# Patient Record
Sex: Male | Born: 1950 | Race: White | Hispanic: No | Marital: Married | Smoking: Never smoker
Health system: Southern US, Academic
[De-identification: ages and names within clinical notes are randomized; demographics above are authoritative.]

## PROBLEM LIST (undated history)

## (undated) DIAGNOSIS — N400 Enlarged prostate without lower urinary tract symptoms: Secondary | ICD-10-CM

## (undated) DIAGNOSIS — C801 Malignant (primary) neoplasm, unspecified: Secondary | ICD-10-CM

## (undated) DIAGNOSIS — K219 Gastro-esophageal reflux disease without esophagitis: Secondary | ICD-10-CM

## (undated) HISTORY — DX: Benign prostatic hyperplasia without lower urinary tract symptoms: N40.0

## (undated) HISTORY — DX: Gastro-esophageal reflux disease without esophagitis: K21.9

## (undated) HISTORY — PX: SKIN SURGERY: SHX2413

## (undated) HISTORY — DX: Malignant (primary) neoplasm, unspecified: C80.1

## (undated) HISTORY — DX: Malignant (primary) neoplasm, unspecified (CMS HCC): C80.1

## (undated) HISTORY — PX: HX TONSILLECTOMY: SHX27

## (undated) HISTORY — PX: SHOULDER SURGERY: SHX246

## (undated) NOTE — Progress Notes (Signed)
 Formatting of this note is different from the original.  Images from the original note were not included.  Subjective   Patient ID: Bobby Flynn is a 35 y.o. male presenting to the Urgent Care with a chief complaint of Dental Pain (Upper left back tooth pain x 3 days. ).    Dental pain left upper molar    History provided by:  Patient  Dental Pain  Location:  Upper  Upper teeth location:  14/LU 1st molar  Quality:  Aching and constant  Onset quality:  Sudden  Duration:  3 days    Objective   Vitals:    10/14/24 0827   BP: (!) 145/71   Pulse: 77   Resp: 18   Temp: 36.6 C (97.8 F)   TempSrc: Oral   SpO2: 98%   Weight: 79.4 kg (175 lb)   Height: 1.803 m (5' 11)     No results found.   Vital signs reviewed.    Physical Exam  Vitals and nursing note reviewed.   Constitutional:       Appearance: Normal appearance.   HENT:      Head: Normocephalic and atraumatic.      Mouth/Throat:     Cardiovascular:      Rate and Rhythm: Normal rate and regular rhythm.      Pulses: Normal pulses.      Heart sounds: Normal heart sounds.   Pulmonary:      Effort: Pulmonary effort is normal.      Breath sounds: Normal breath sounds.   Neurological:      Mental Status: He is alert.         Assessment & Plan    Assessment & Plan  Dental abscess        Other orders    amoxicillin-clavulanate (Augmentin) 875-125 MG tablet; Take 1 tablet by mouth 2 times a day for 7 days.    In-House Lab Results:   No results found for this or any previous visit.     In-House Imaging Reads:        Procedure Documentation:  Procedures     ED Course & MDM   MDM - Medical Decision Making: Independent historian used.   Electronically signed by Estell Palmer, DO at 10/14/2024  8:37 AM EST

---

## 2002-03-05 ENCOUNTER — Other Ambulatory Visit (HOSPITAL_COMMUNITY): Payer: Self-pay

## 2018-12-01 IMAGING — US US RETROPERITONEAL COMPLETE
1 series · 13 of 25 positions shown · non-contrast
Comparison: None.

EXAM:  US RETROPERITONEAL COMPLETE
INDICATION: History of left kidney cyst.  Chronic kidney disease.
TECHNIQUE: Multiplanar sonographic images of the retroperitoneum were performed with attention to the urinary tract.

[Series 1: us retroperitoneal complete · 0.24mm/px · 13 of 67 slices shown]
[im 1/67]
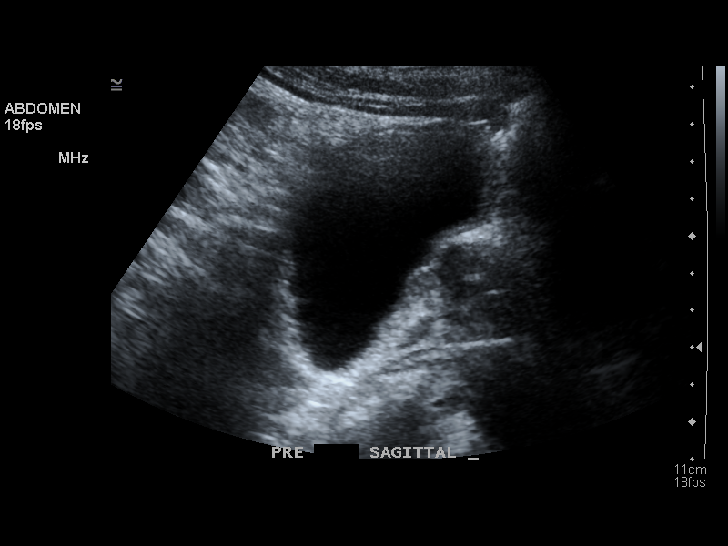
[im 6/67]
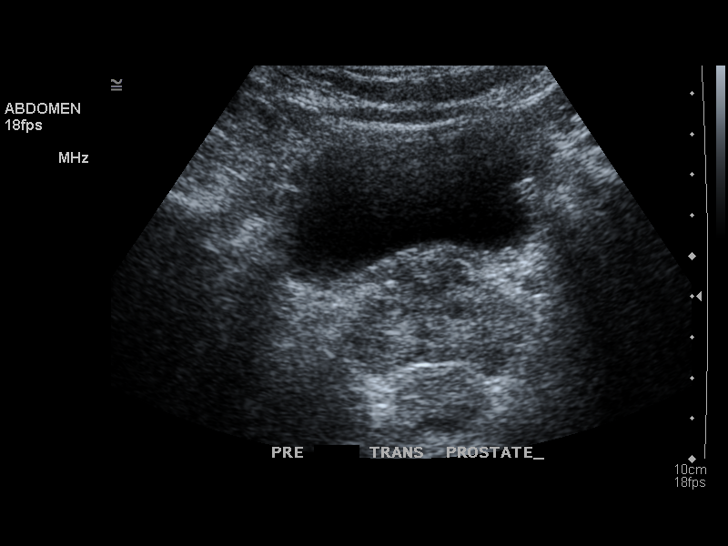
[im 12/67]
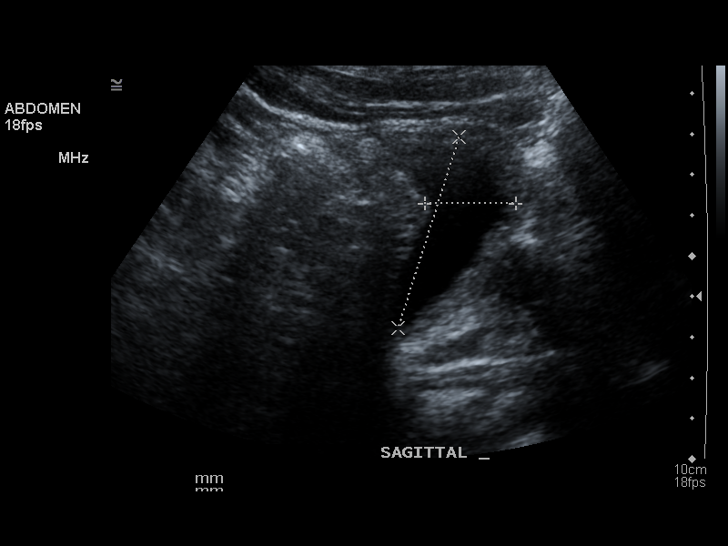
[im 17/67]
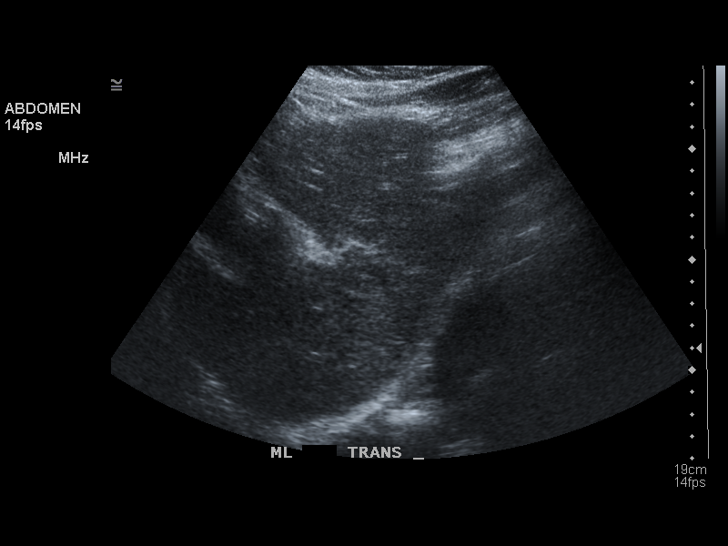
[im 23/67]
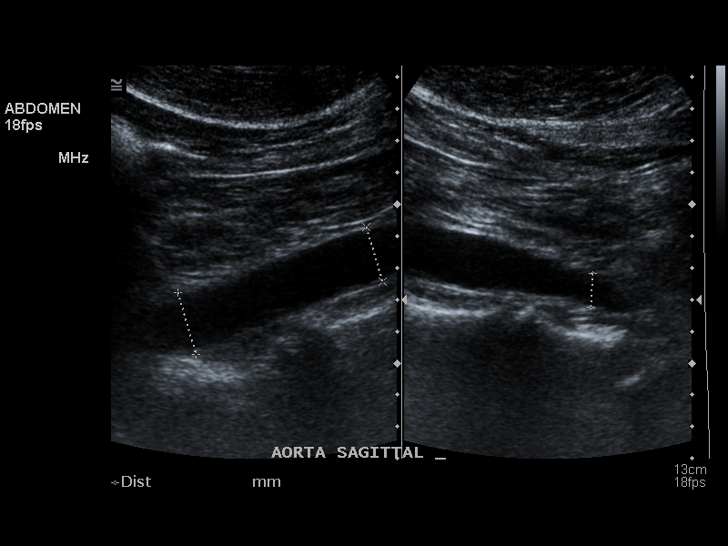
[im 28/67]
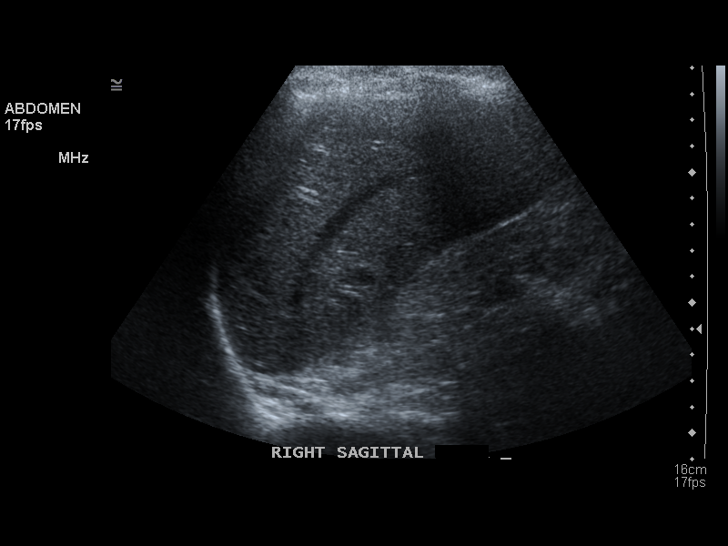
[im 34/67]
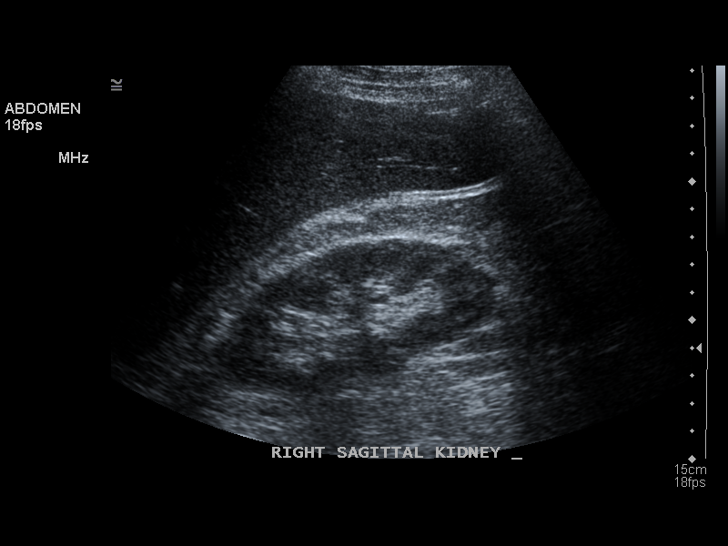
[im 39/67]
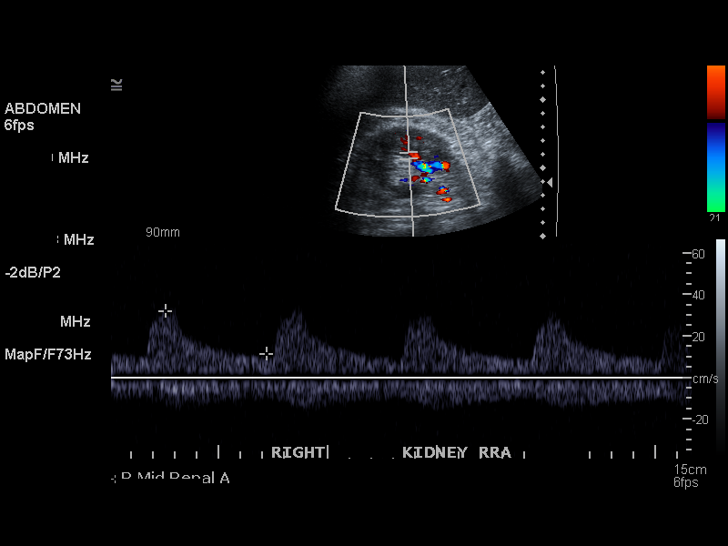
[im 45/67]
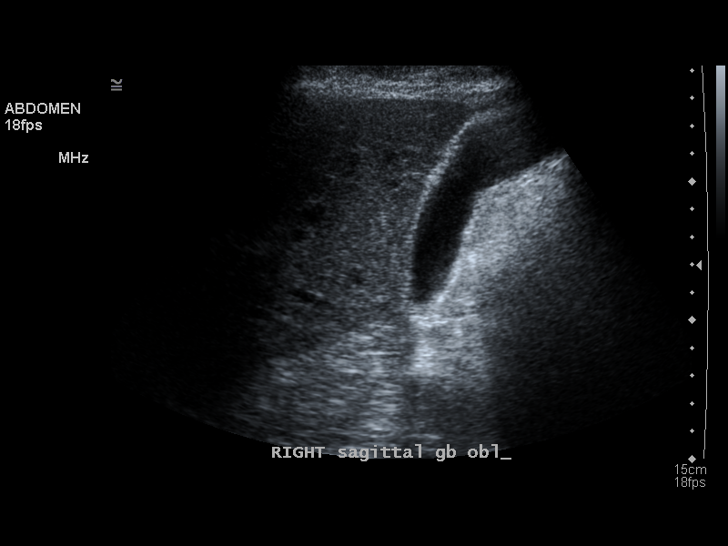
[im 50/67]
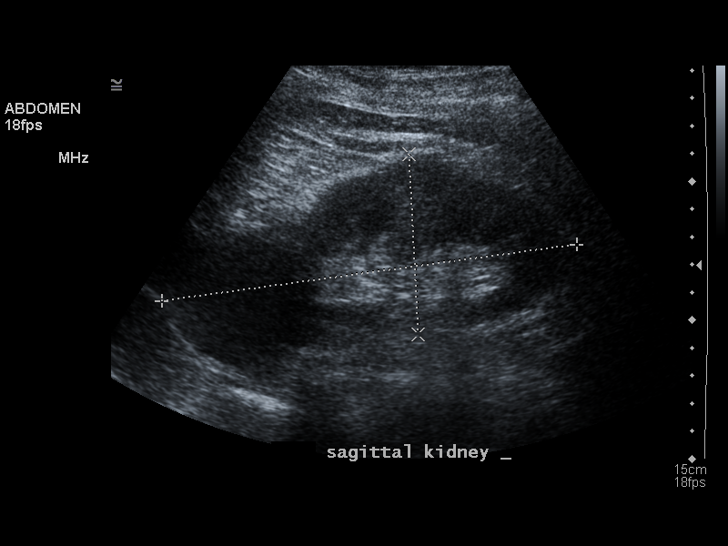
[im 56/67]
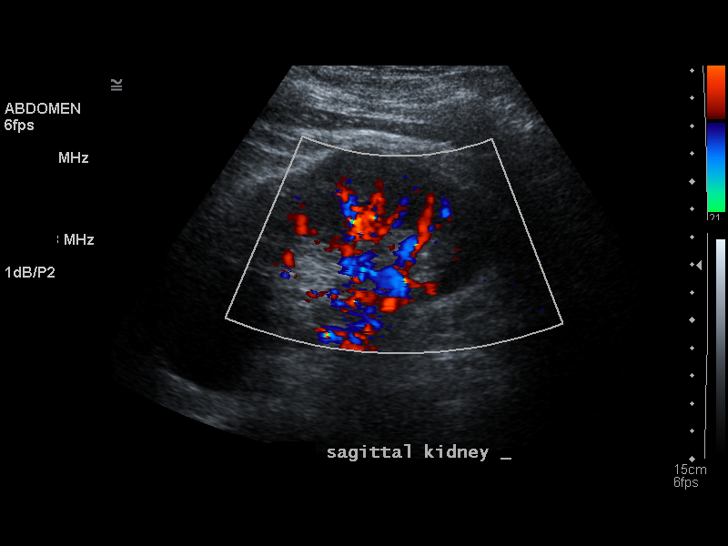
[im 61/67]
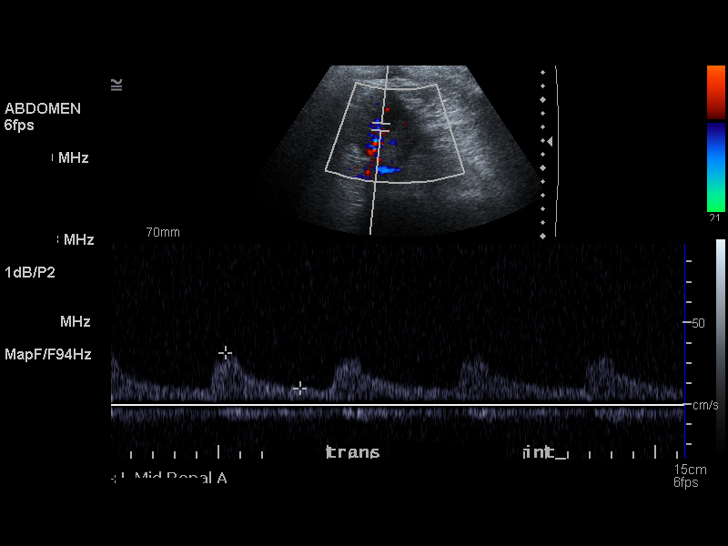
[im 67/67]
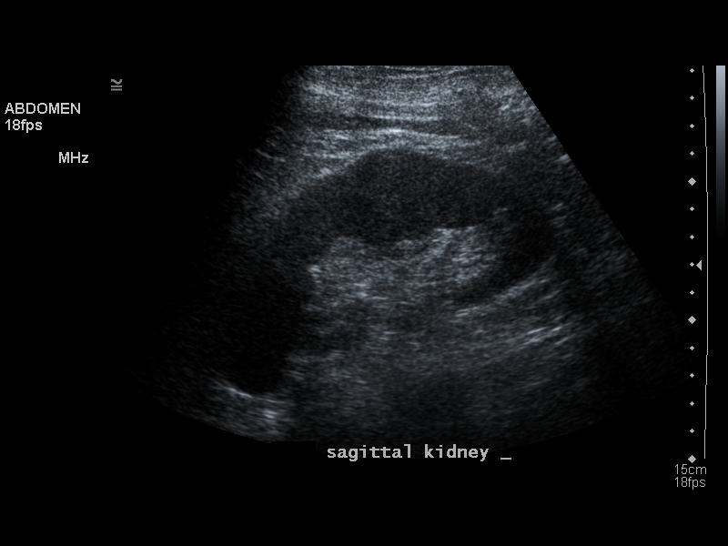

[13 of 25 positions shown; findings below may reference images not displayed]

FINDINGS: The right kidney measures 11.9 x 5.0 x 4.9 cm.  The left kidney measures 15.1 x 6.5 x 5.6 cm.  There is a septated, exophytic cyst in the upper pole of the left kidney.  It measures 8.3 x 5.7 x 7.5 cm.  Renal echogenicity is otherwise normal.  No shadowing calculi are seen.  There is no hydronephrosis.  Right intrarenal resistive index is 0.68.  Left intrarenal resistive index is 0.74.  Sonographic survey of the urinary bladder is unremarkable.  Prevoid bladder volume is 145.5 cc.  Postvoid residual is 20 cc.  Note is made of prostatic enlargement, measuring 4.6 x 2.7 x 5.1 cm.  It contains echogenic foci consistent with calcifications.  Sonographic survey of the remainder of the abdomen demonstrates normal appearance to the IVC and visualized portions of the pancreas.  Abdominal aorta has normal dimensions.  Liver size is normal.
IMPRESSION: Left renal cyst.  

No calculus identified.  

No hydronephrosis.  

Prostatic enlargement.

## 2019-12-17 IMAGING — US US RETROPERITONEAL COMPLETE
1 series · 14 of 25 positions shown · non-contrast
Comparison: Sonogram dated 12/01/2018.

EXAM:  US RETROPERITONEAL COMPLETE
INDICATION: Left renal cyst.

[Series 3: us retroperitoneal complete · 0.37mm/px · 14 of 77 slices shown]
[im 1/77]
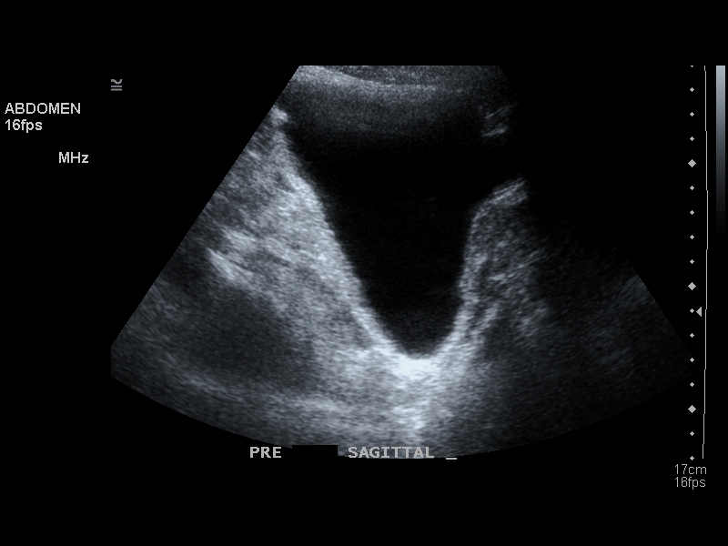
[im 7/77]
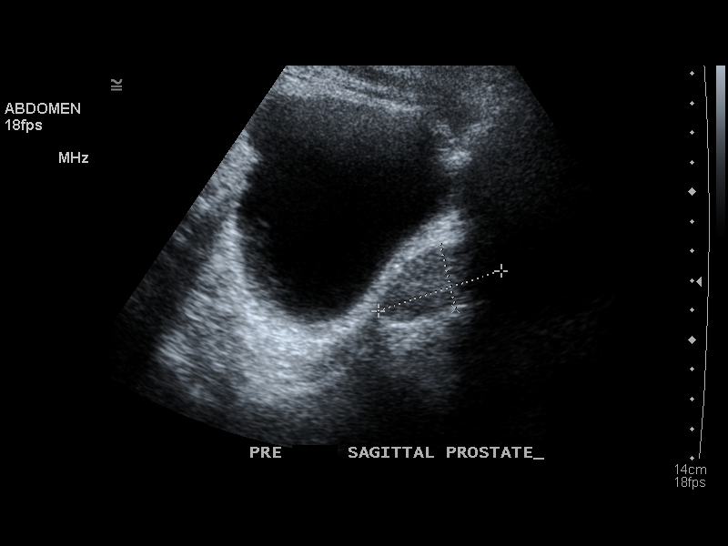
[im 13/77]
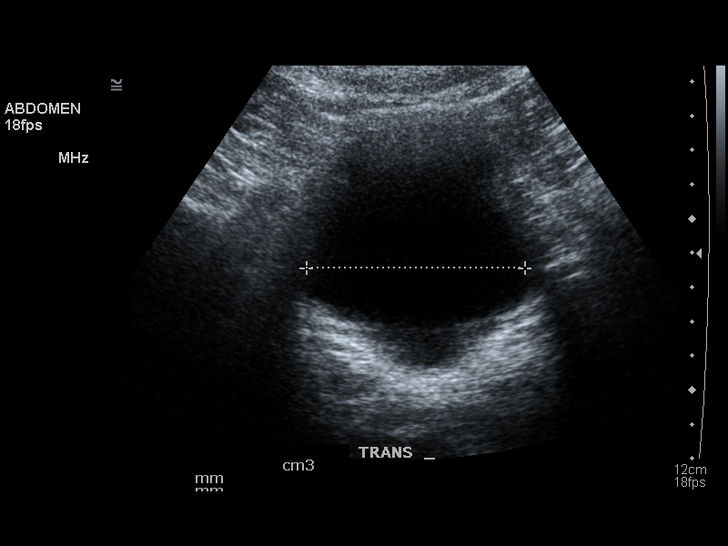
[im 20/77]
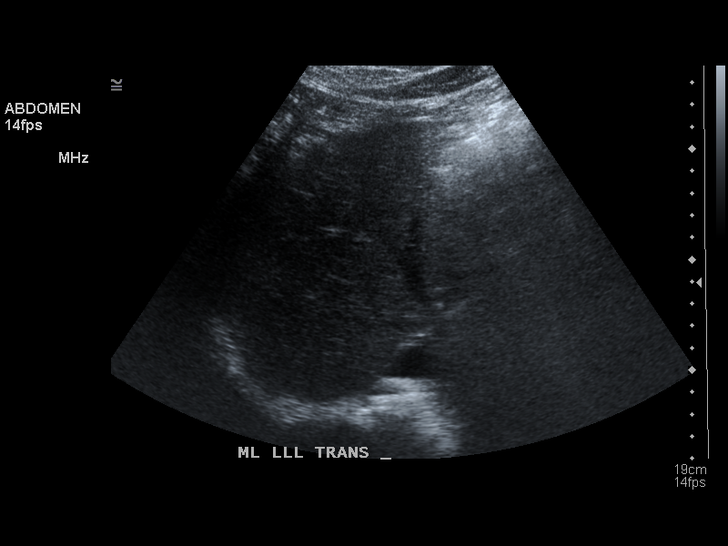
[im 26/77]
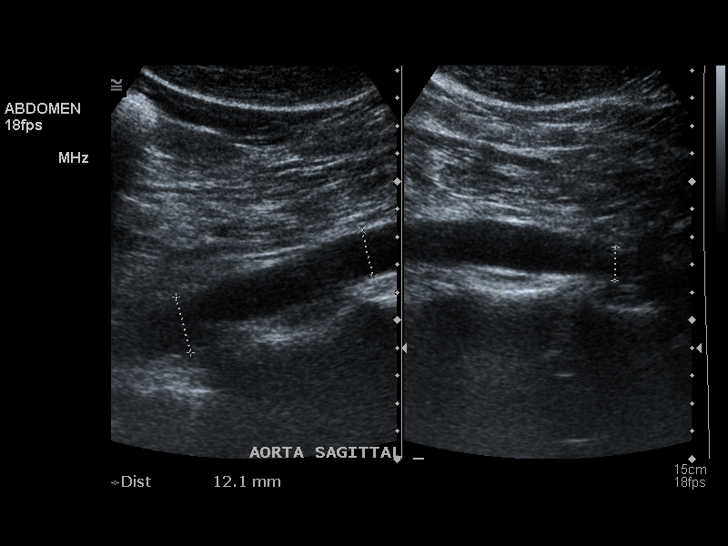
[im 29/77]
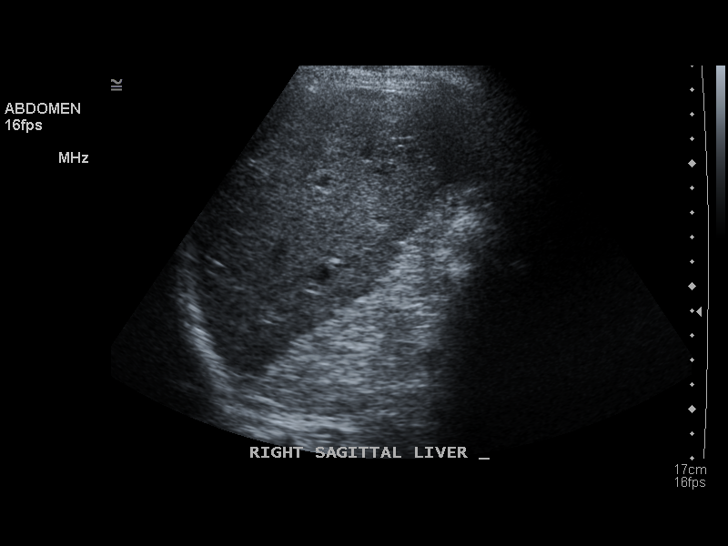
[im 35/77]
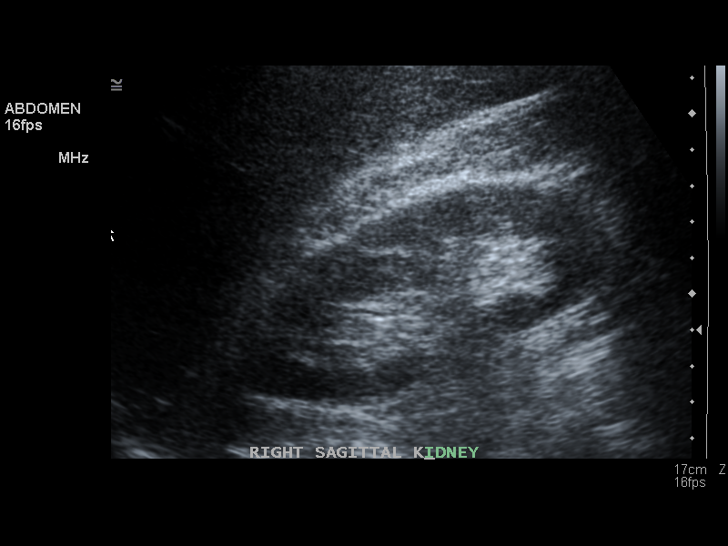
[im 42/77]
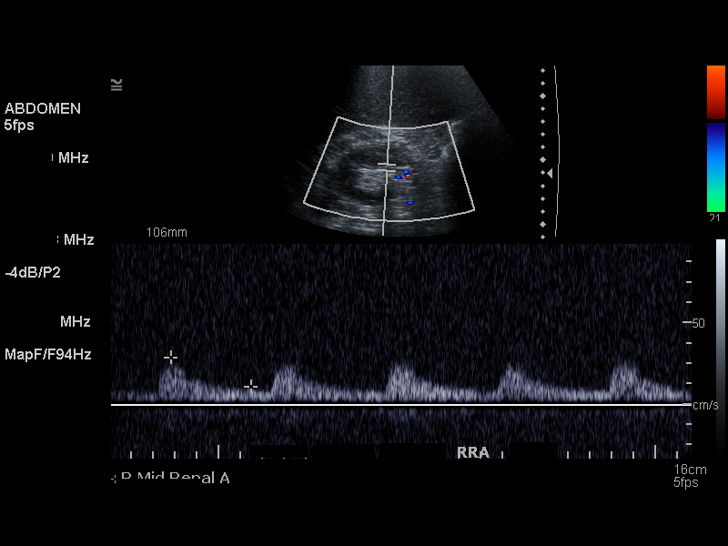
[im 48/77]
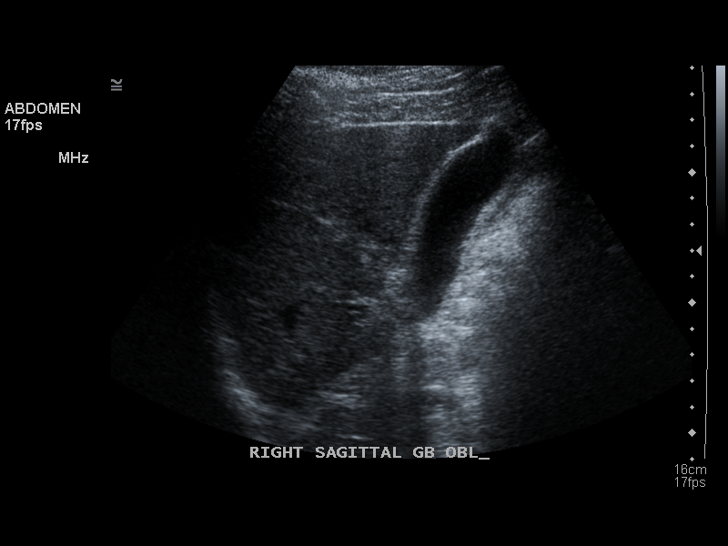
[im 51/77]
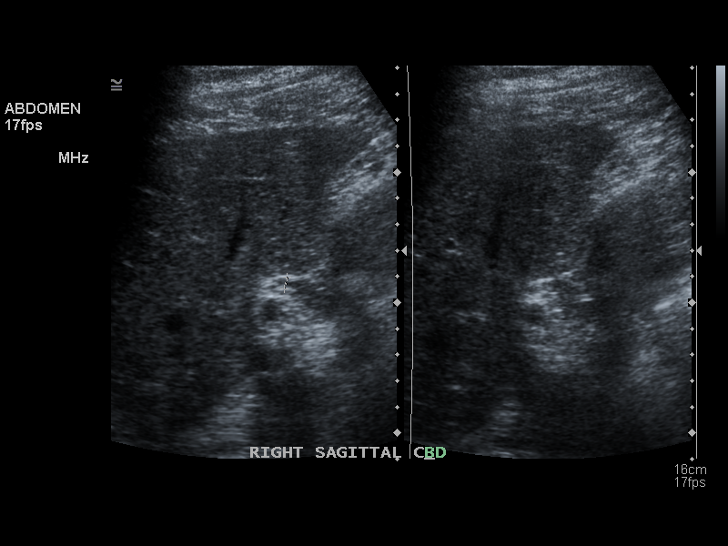
[im 58/77]
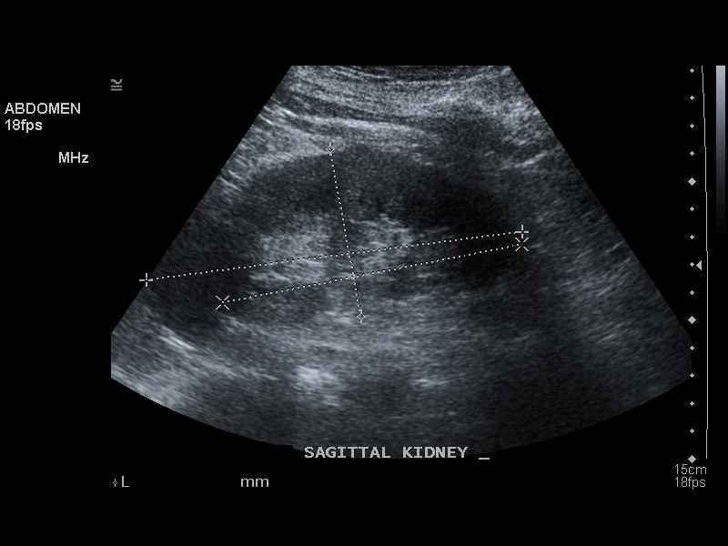
[im 64/77]
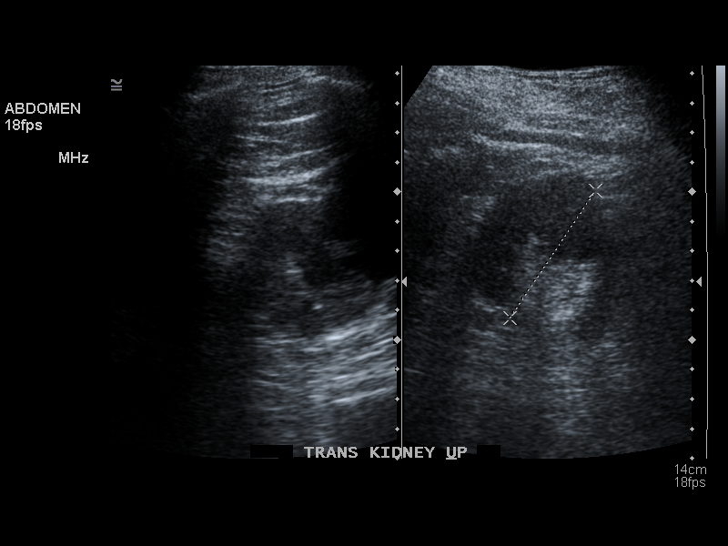
[im 70/77]
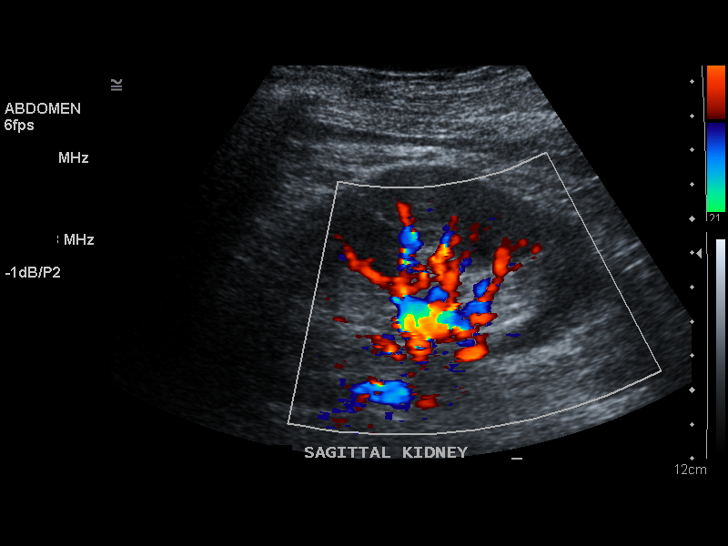
[im 77/77]
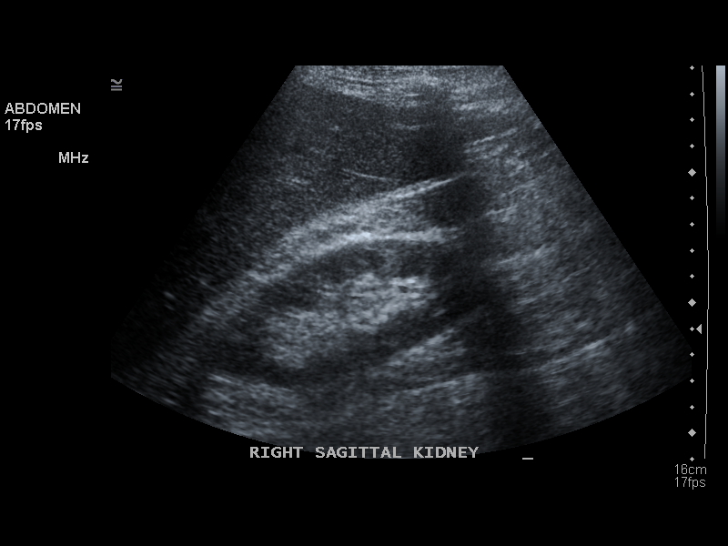

[14 of 25 positions shown; findings below may reference images not displayed]

FINDINGS: Kidneys are normal in echogenicity and measure 11 cm bilaterally. 8 cm lobulated left kidney upper pole exophytic cyst is again identified. There is no hydronephrosis, mass or shadowing calculus on either side.

Urinary bladder is partially distended and grossly unremarkable. There is a significant post void residual of 167 cubic cm.
IMPRESSION: Stable 8 cm left kidney upper pole cyst. 

Significant post void residual.

## 2020-12-29 IMAGING — US US RETROPERITONEAL COMPLETE
1 series · 14 of 25 positions shown · non-contrast
Comparison: Sonogram dated 12/17/2019.

﻿EXAM:  US RETROPERITONEAL COMPLETE
INDICATION: History of left renal cyst.

[Series 1: us retroperitoneal complete · 14 of 63 slices shown]
[im 1/63]
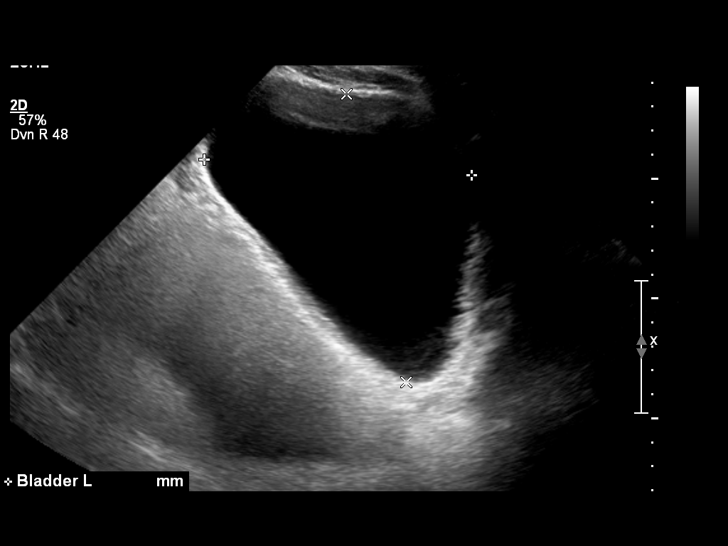
[im 6/63]
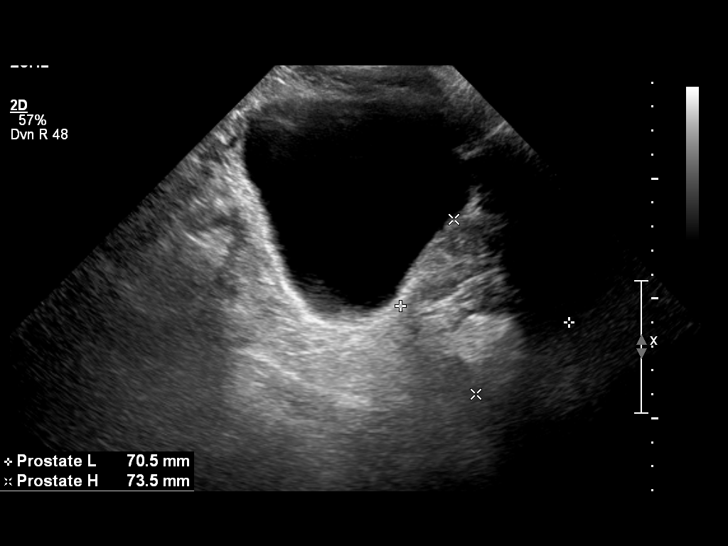
[im 11/63]
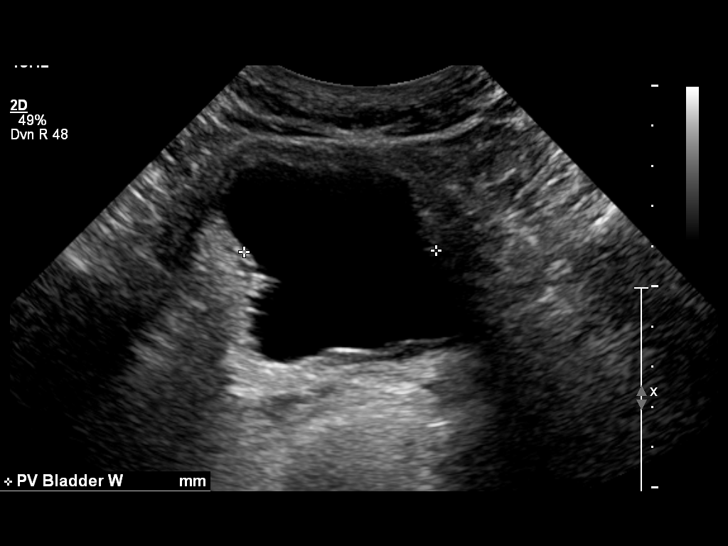
[im 16/63]
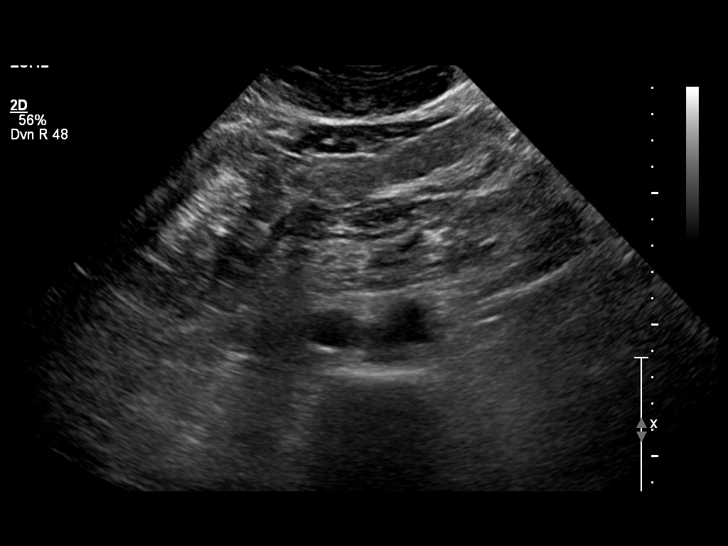
[im 21/63]
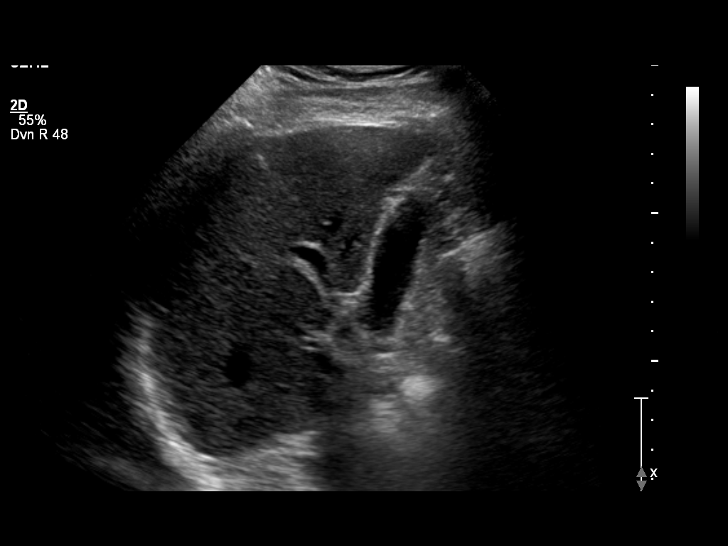
[im 24/63]
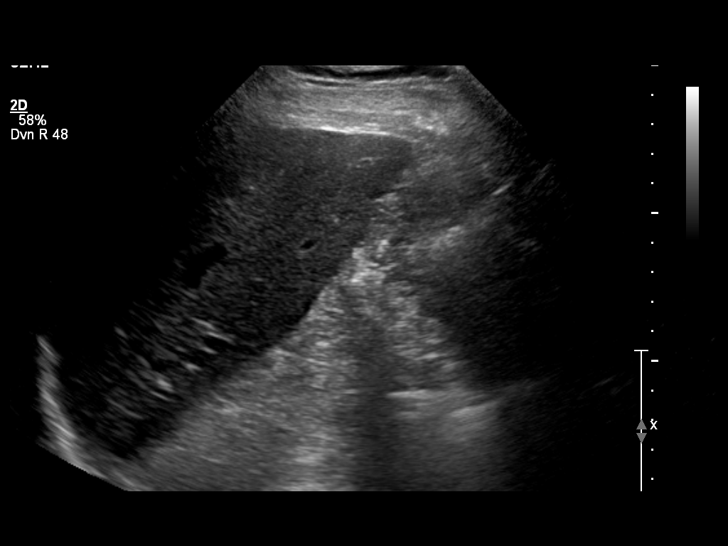
[im 29/63]
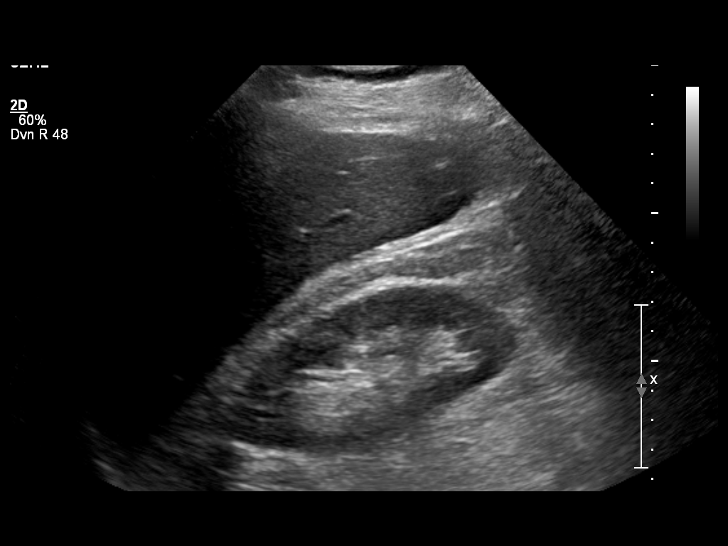
[im 34/63]
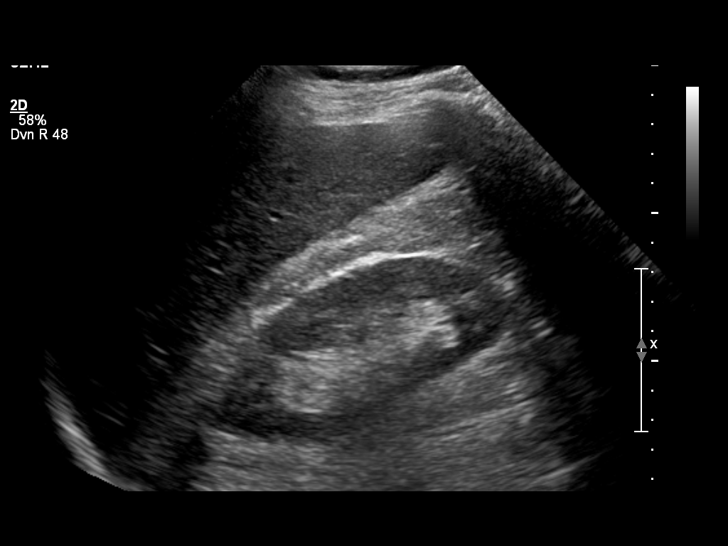
[im 39/63]
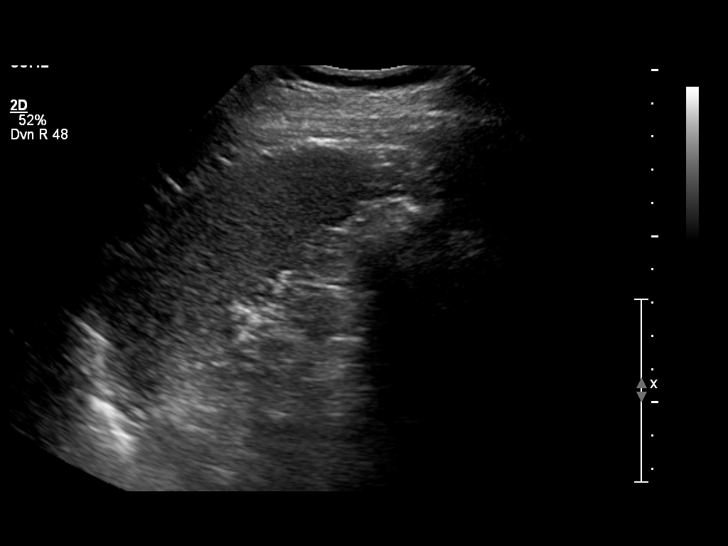
[im 42/63]
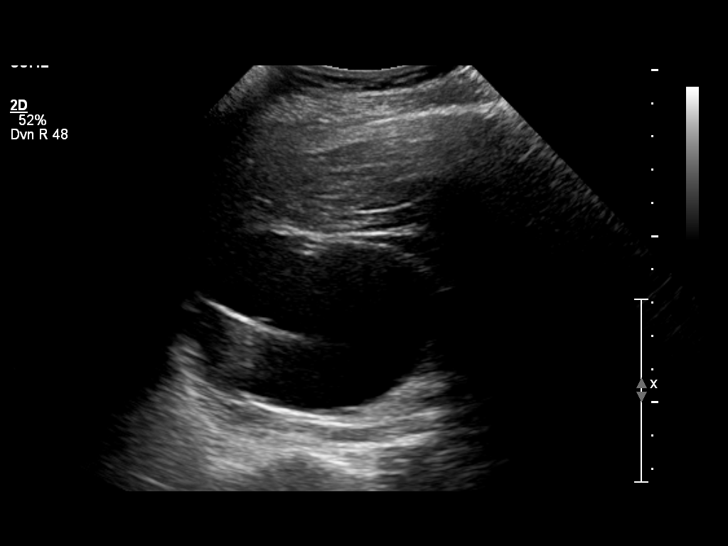
[im 47/63]
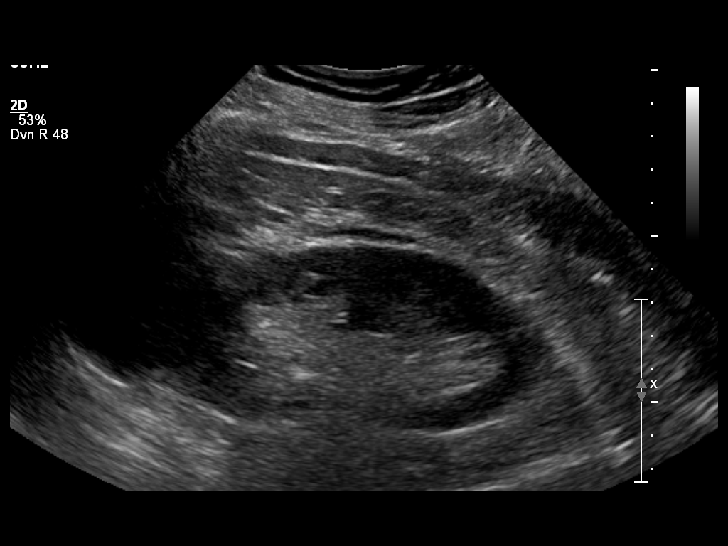
[im 52/63]
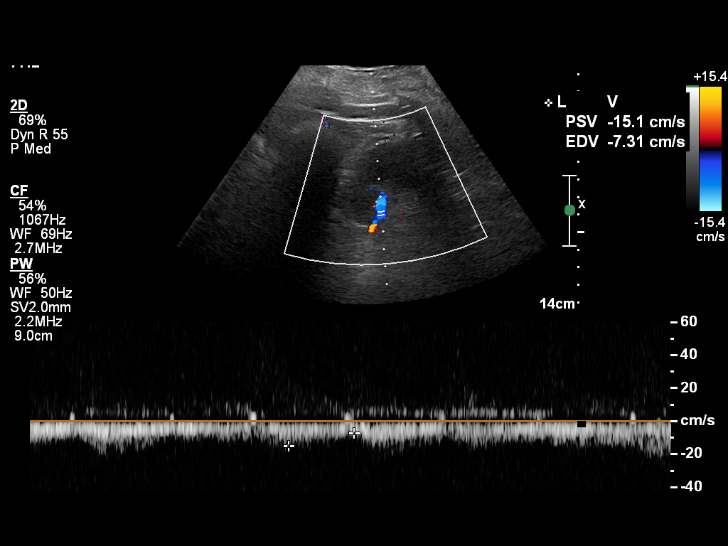
[im 57/63]
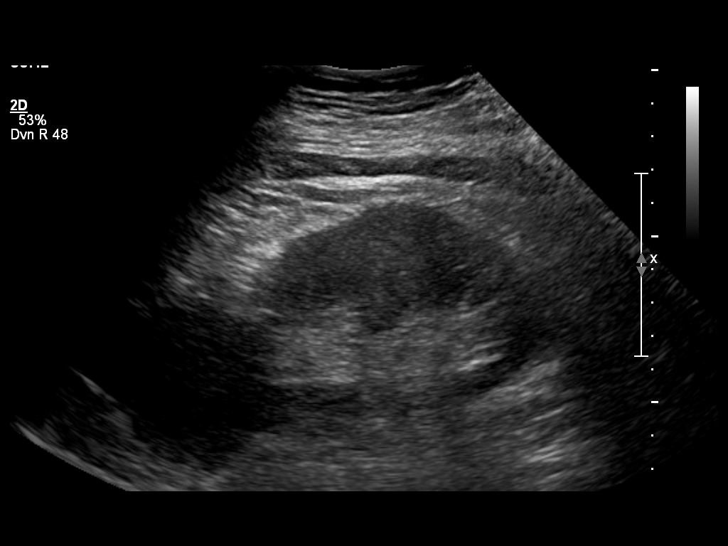
[im 63/63]
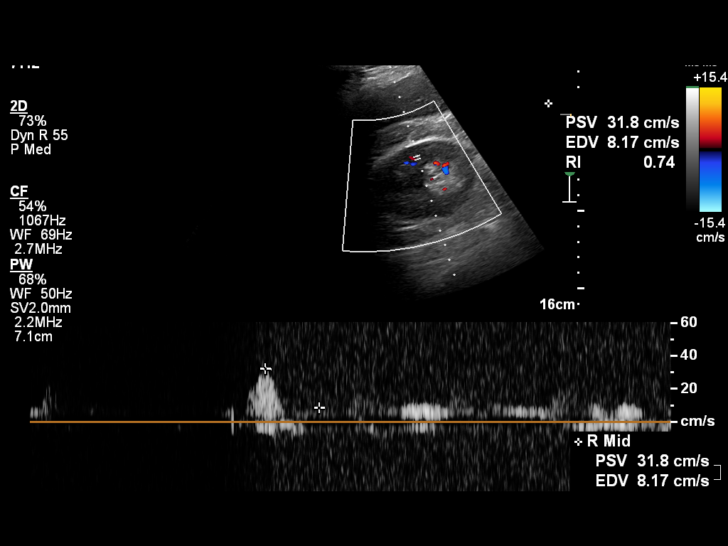

[14 of 25 positions shown; findings below may reference images not displayed]

FINDINGS: Kidneys are normal in echogenicity. Right kidney measures 10.5 cm and left kidney measures 11.5 cm. Previously seen 8 cm left kidney upper pole cyst is again identified. There is no hydronephrosis, mass or shadowing calculus on either side.

Urinary bladder is partially distended and grossly unremarkable. There is a significant postvoid residual of 76 mL. Prostate gland is also enlarged measuring 8.2 x 7.4 x 7.1 cm.
IMPRESSION: 1. Stable 8 cm left kidney upper pole cyst. 

2. Stable significant postvoid residual, likely from enlarged prostate gland.

## 2022-02-22 IMAGING — US US RETROPERITONEAL COMPLETE
1 series · 14 of 25 positions shown · non-contrast
Comparison: Sonogram dated 12/29/2020 and 12/17/2019.

﻿EXAM:  US RETROPERITONEAL COMPLETE
INDICATION: Follow-up left renal cyst.

[Series 1: us retroperitoneal complete · 14 of 71 slices shown]
[im 1/71]
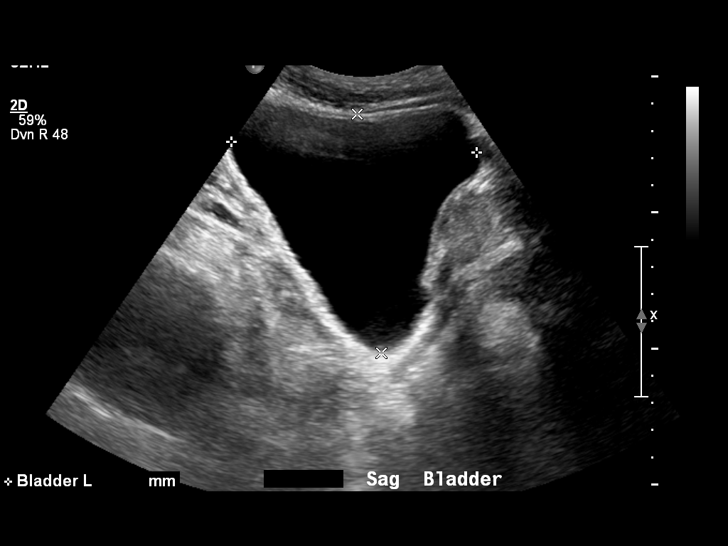
[im 6/71]
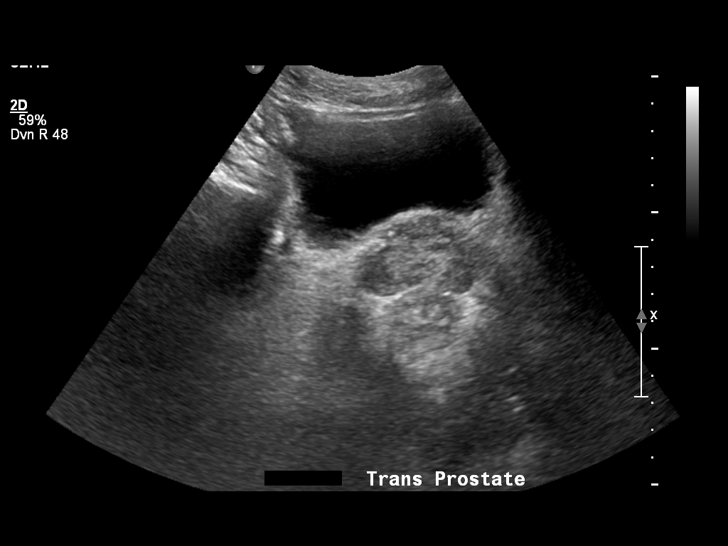
[im 12/71]
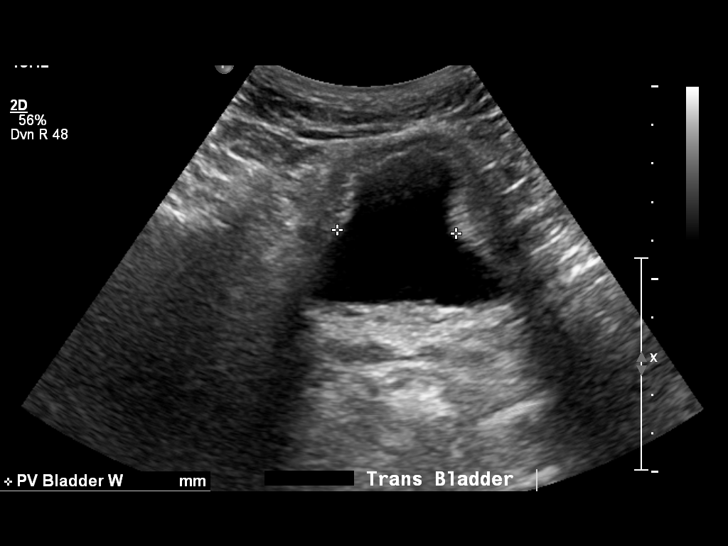
[im 18/71]
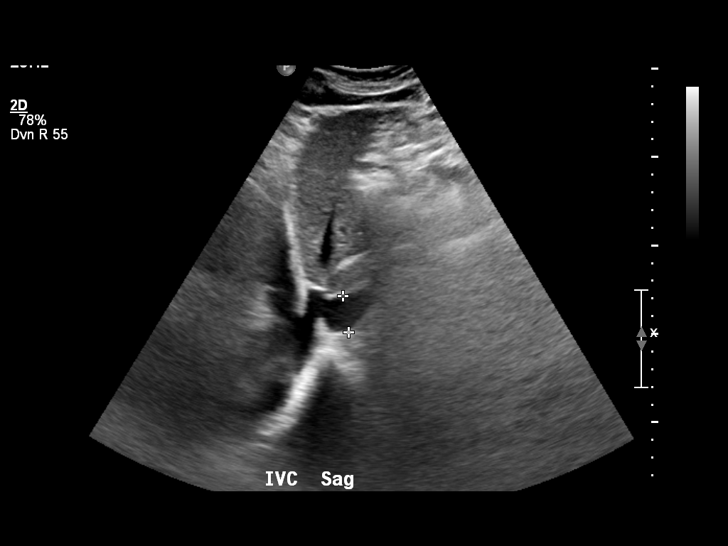
[im 24/71]
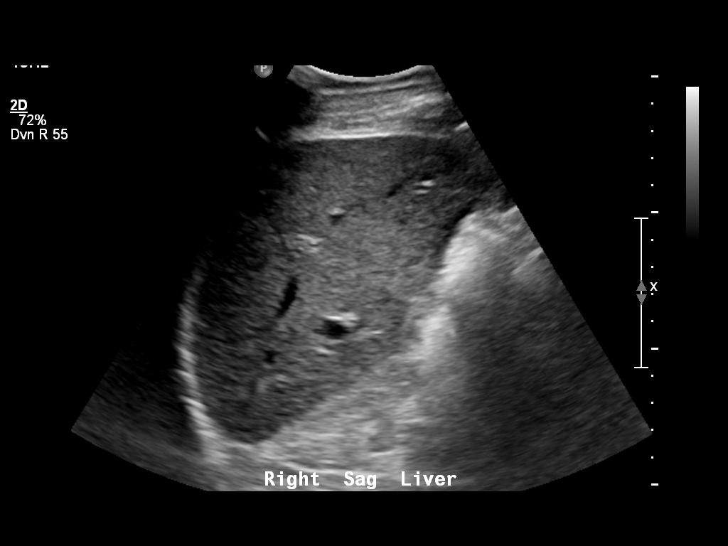
[im 27/71]
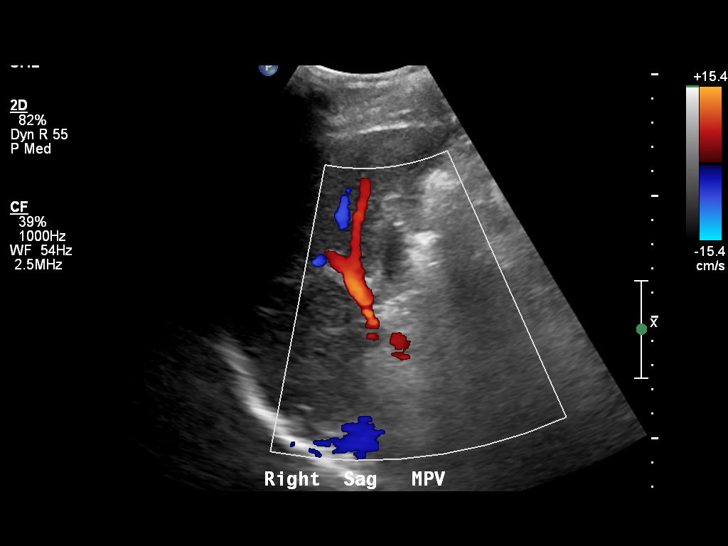
[im 33/71]
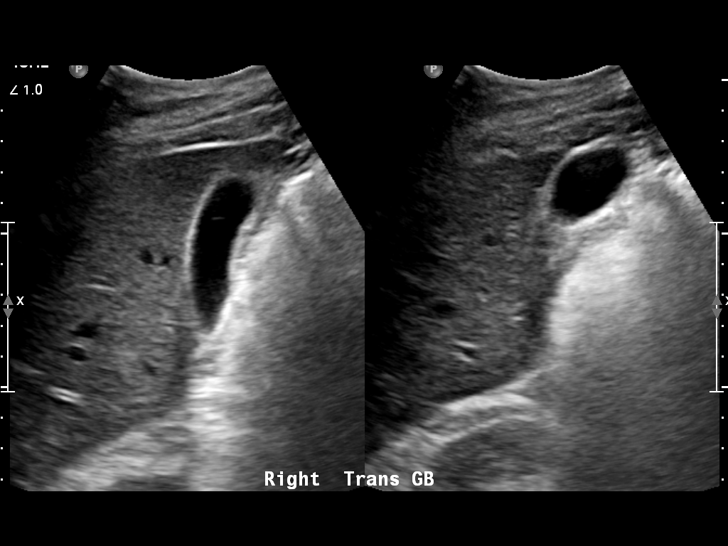
[im 38/71]
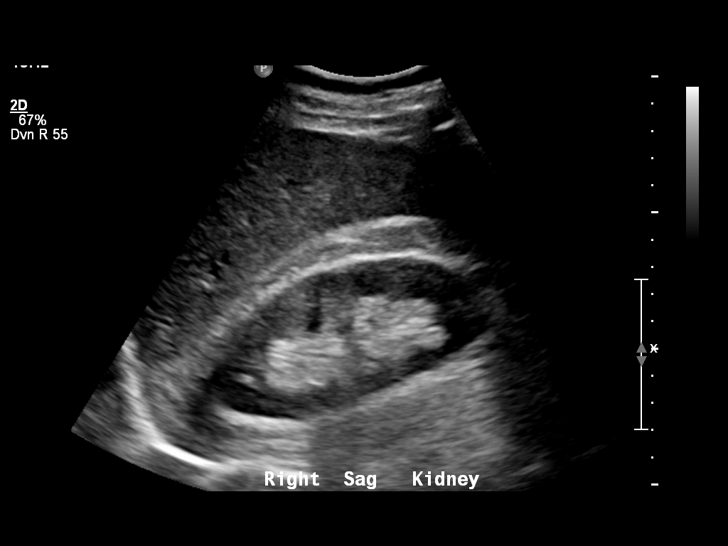
[im 44/71]
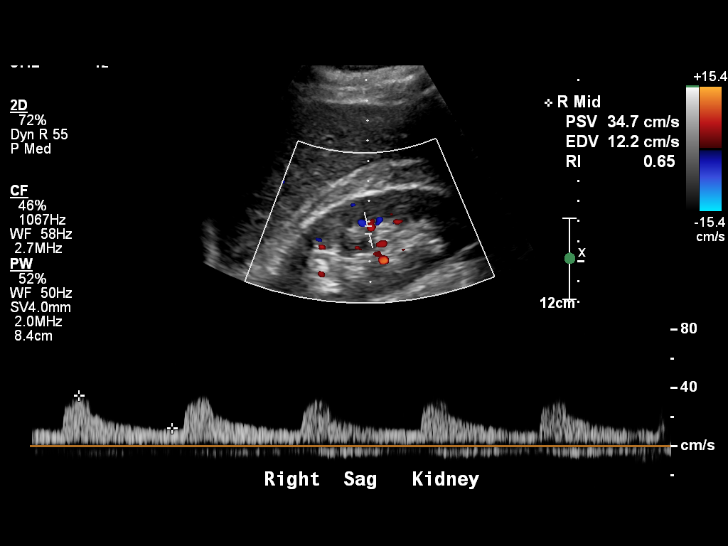
[im 47/71]
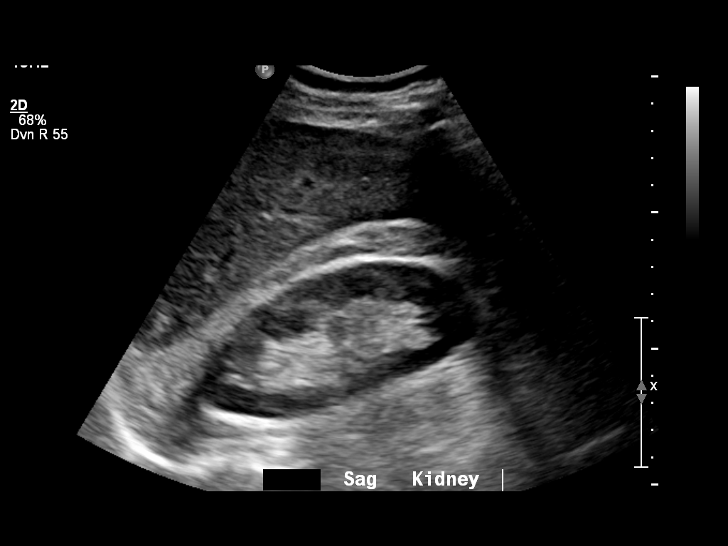
[im 53/71]
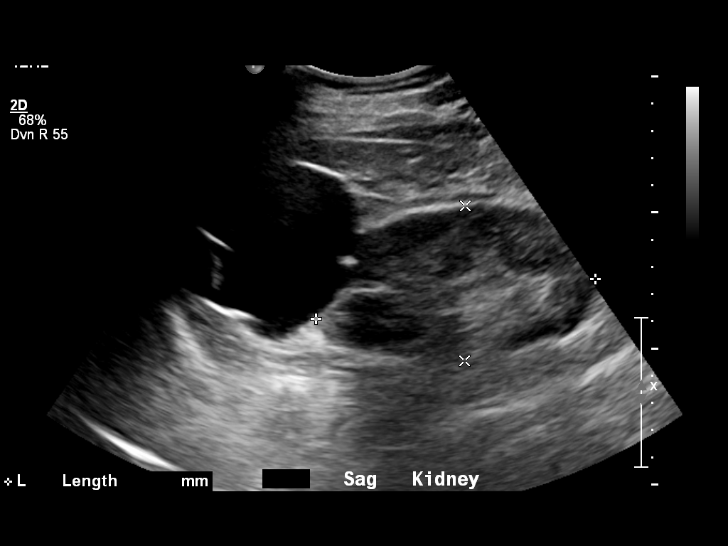
[im 59/71]
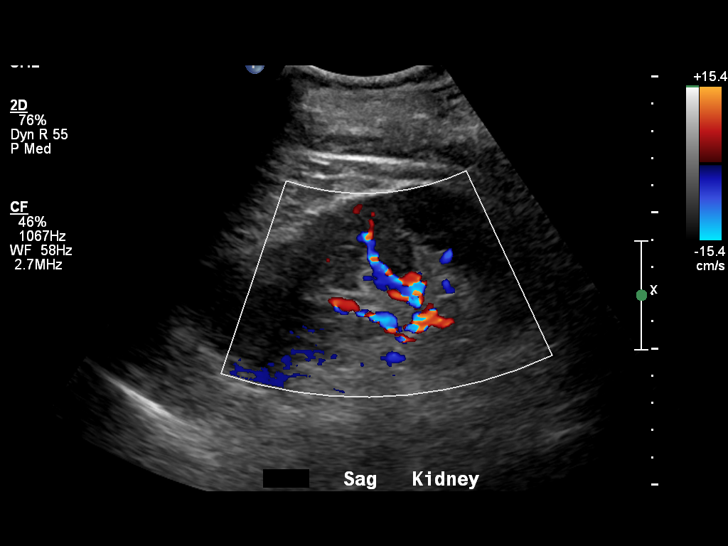
[im 65/71]
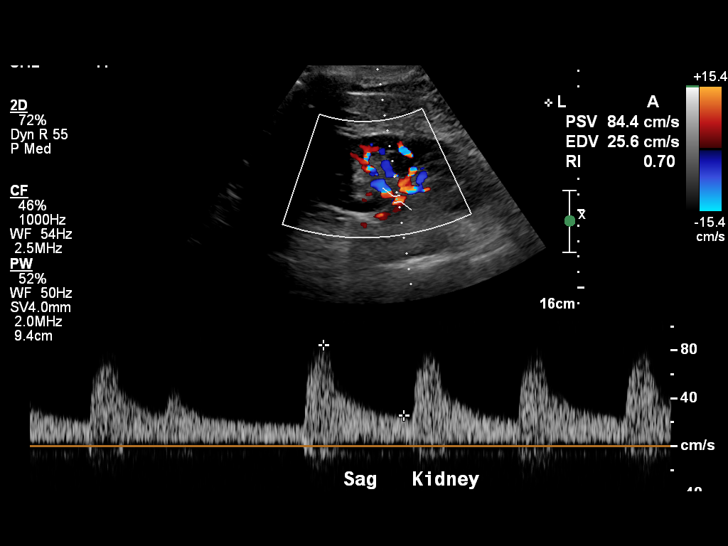
[im 71/71]
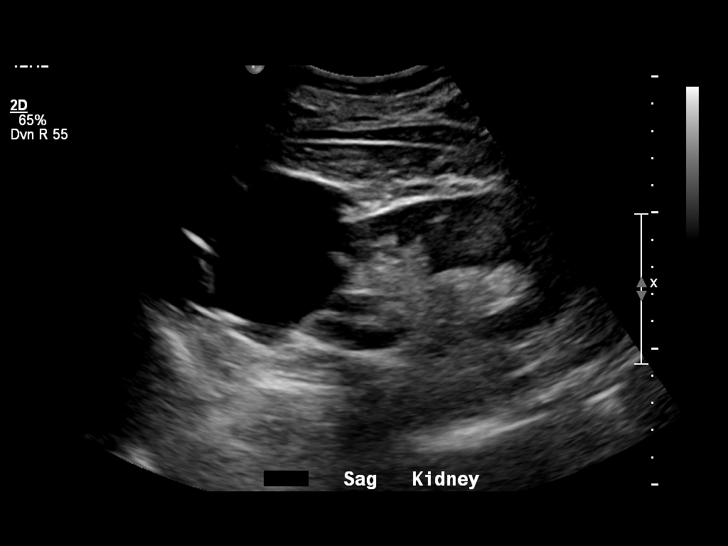

[14 of 25 positions shown; findings below may reference images not displayed]

FINDINGS: Kidneys are normal in echogenicity and measure 10.5 cm bilaterally.  An 8 cm septated left kidney upper pole cyst is again noted.  There is no hydronephrosis, mass or shadowing calculus on either side. 

Urinary bladder is adequately distended and grossly unremarkable.  There is a small postvoid residual of 32 mL.
IMPRESSION: 1. Stable 8 cm left kidney upper pole cyst.  

2. Small postvoid residual.

## 2022-05-26 IMAGING — MR MRI HIP LT W/O CONTRAST
5 of 7 series · 24 of 40 positions shown · IV contrast (gadolinium)
Comparison: Radiographs from outside facility dated 05/11/2022.

﻿EXAM:  [id number]   MRI HIP LT W/O CONTRAST
INDICATION: Left hip pain for several months.
TECHNIQUE: Multiplanar multisequential MRI of the pelvis and left hip joint was performed without gadolinium contrast.

[Series 7: T2 fat-sat · axial · left · 4.5mm · 0.94mm/px · z∈[-65,+79]mm · 6 of 30 slices shown (1 of 3)]
[im 1/30]
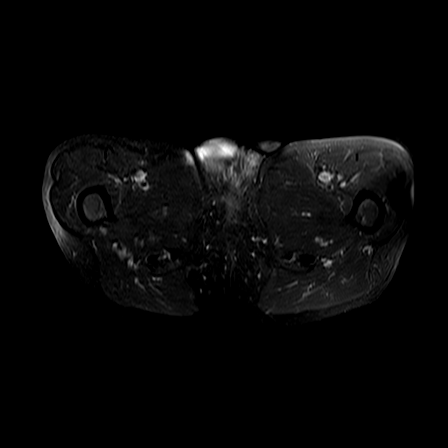
[im 6/30]
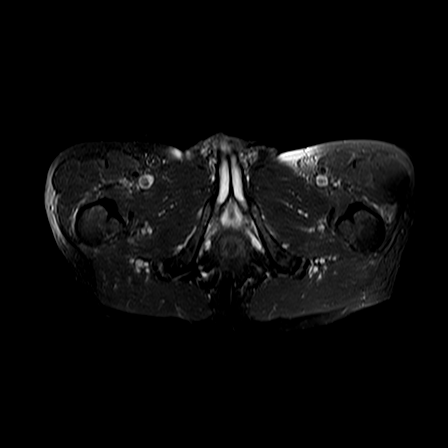
[im 12/30]
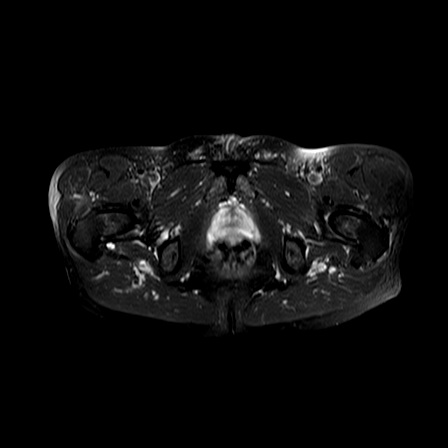
[im 18/30]
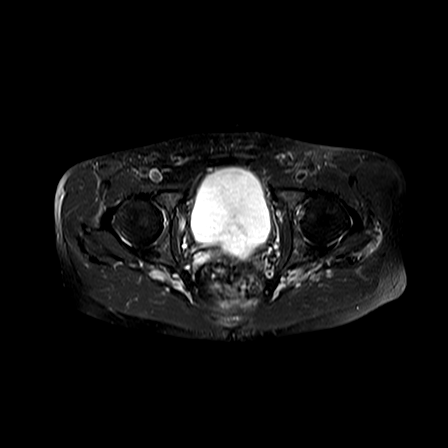
[im 24/30]
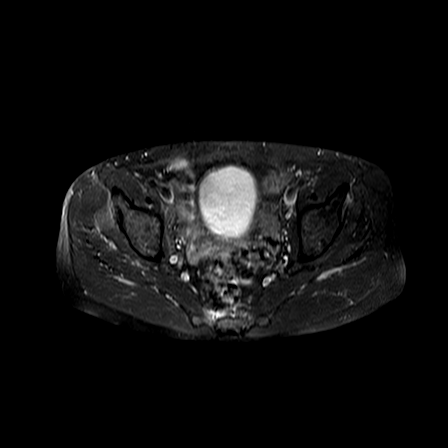
[im 30/30]
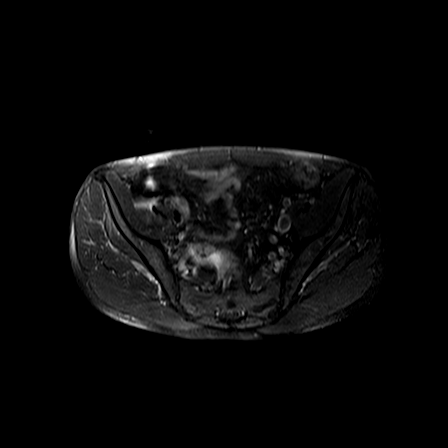

[Series 8: T1 · axial · left · 4.5mm · 0.82mm/px · z∈[-65,+79]mm · 7 of 30 slices shown (1 of 2)]
[im 1/30]
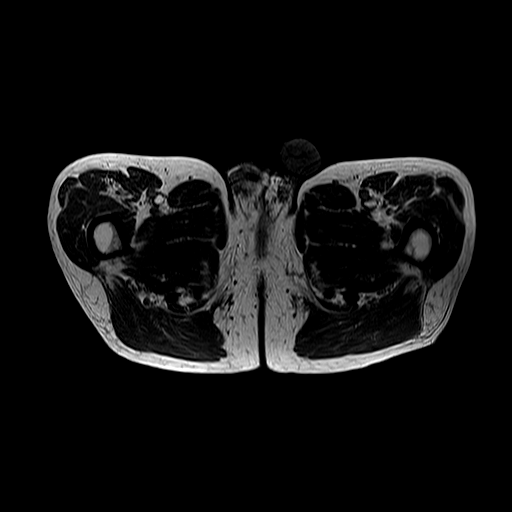
[im 5/30]
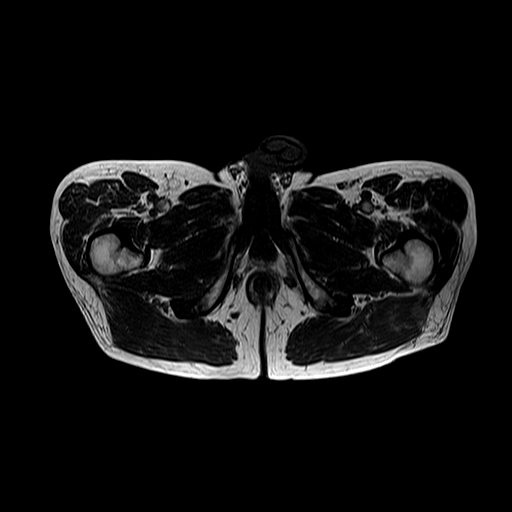
[im 10/30]
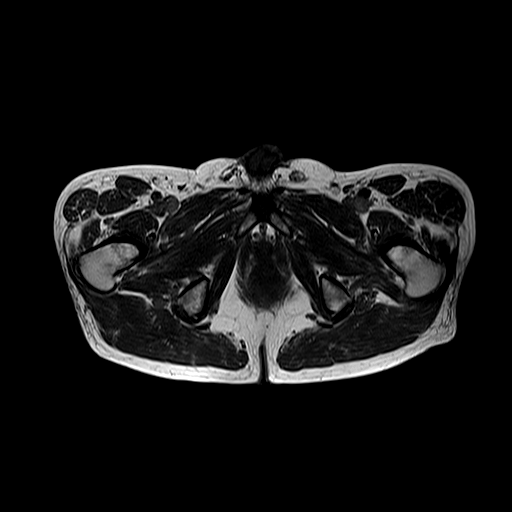
[im 15/30]
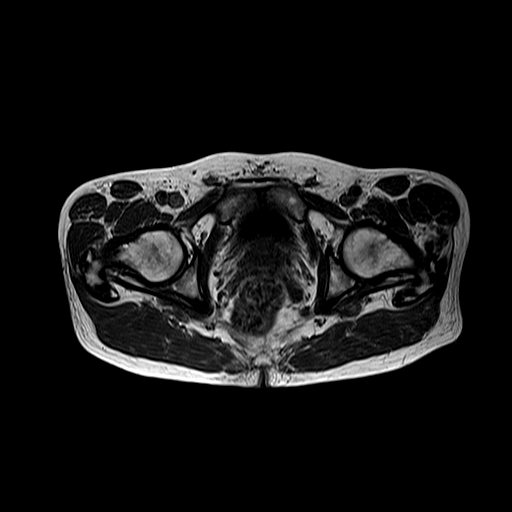
[im 20/30]
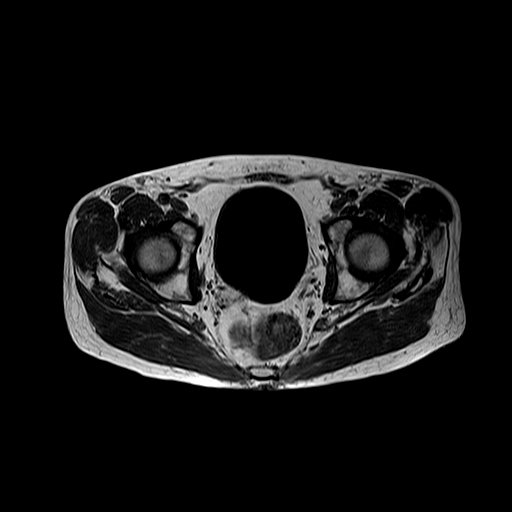
[im 25/30]
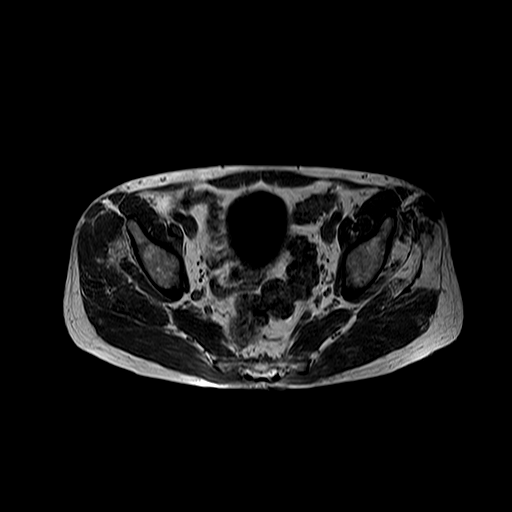
[im 30/30]
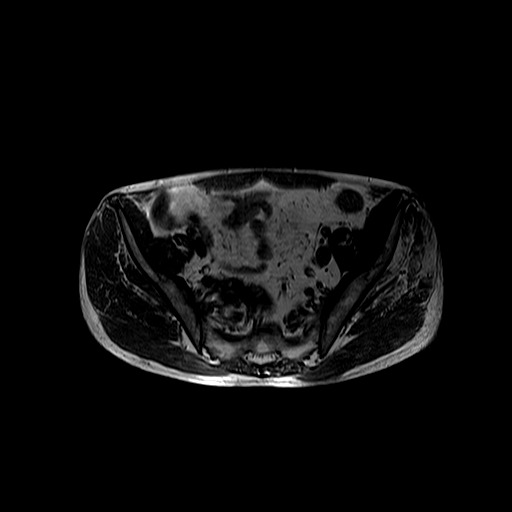

[Series 10: T1 · coronal · left · 4.5mm · 0.82mm/px · 1 of 21 slices shown (2 of 2)]
[im 1/21]
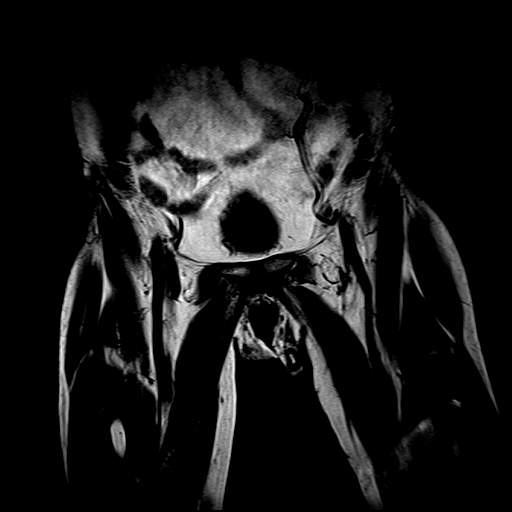

[Series 11: T2 fat-sat · coronal · left · 4.5mm · 0.82mm/px · 5 of 21 slices shown (2 of 3)]
[im 1/21]
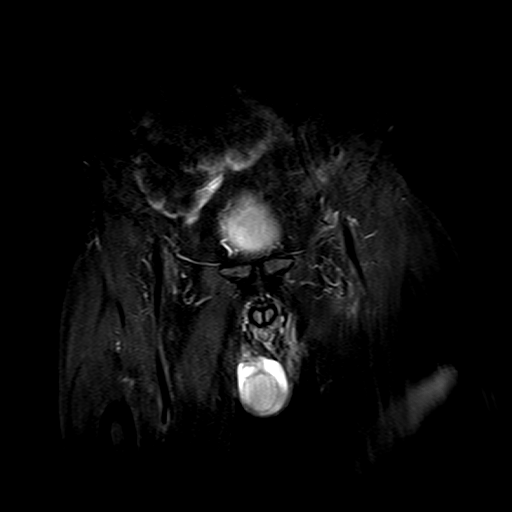
[im 6/21]
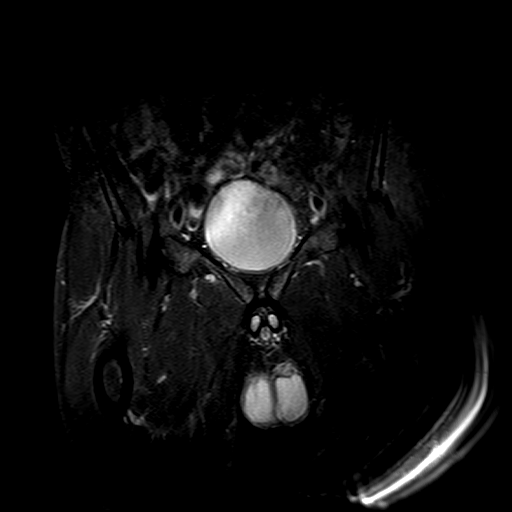
[im 11/21]
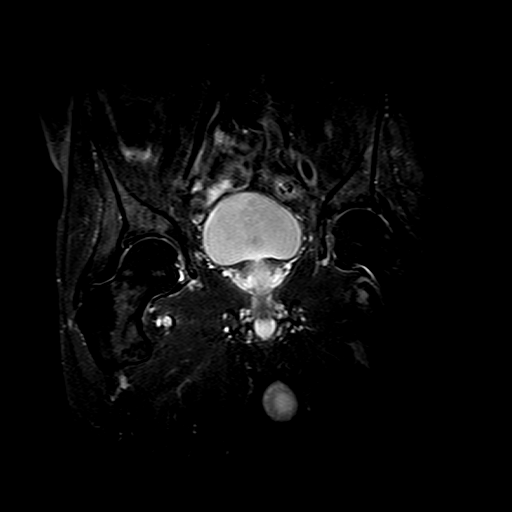
[im 16/21]
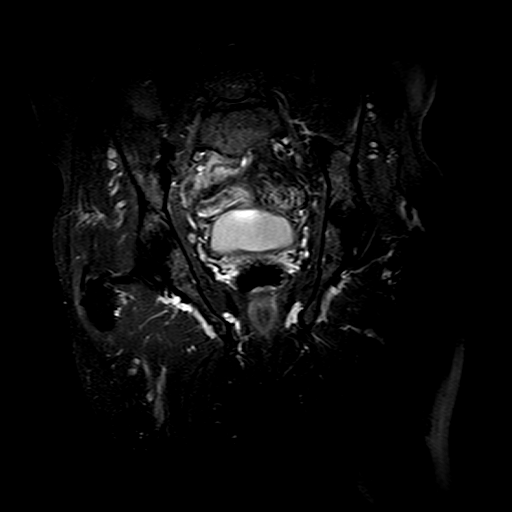
[im 21/21]
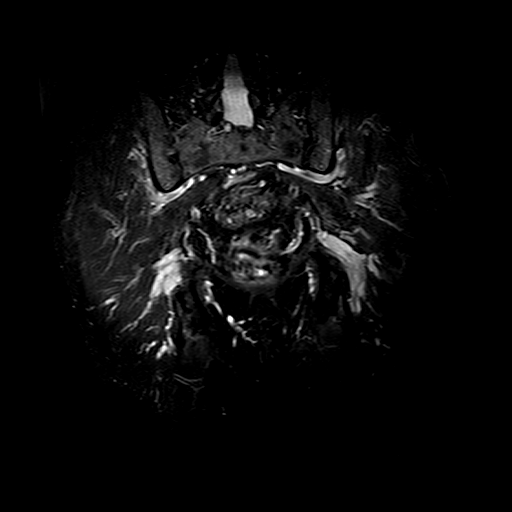

[Series 13: T2 fat-sat · sagittal · left · 4.5mm · 0.78mm/px · 5 of 20 slices shown (3 of 3)]
[im 1/20]
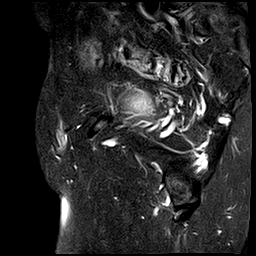
[im 5/20]
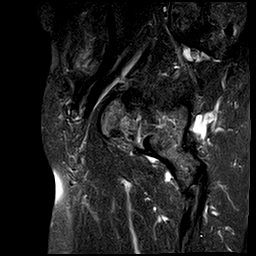
[im 10/20]
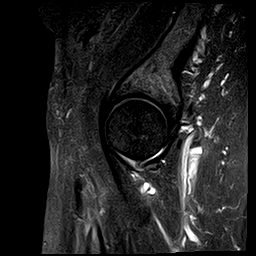
[im 15/20]
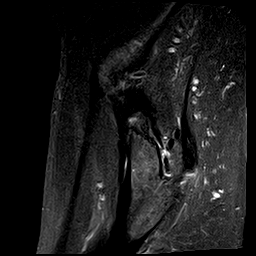
[im 20/20]
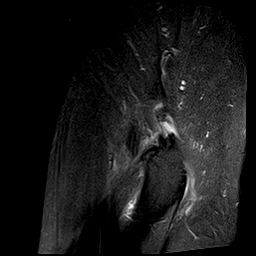

[24 of 40 positions shown; findings below may reference images not displayed]

FINDINGS: Bone marrow signal intensity is normal.  There is no acute fracture or subluxation.  Hip joints are well maintained.  No significant hip effusion is seen on either side.  Left gluteus medius tendon is completely torn and slightly retracted.  There is no evidence of iliopsoas or greater trochanteric bursitis.  Remaining muscles and soft tissues about the pelvic girdle appear unremarkable.  Evaluation of pelvic parenchymal structures is limited without definite abnormality.
IMPRESSION: Completely torn and slightly retracted left gluteus medius tendon.

## 2022-05-26 IMAGING — MR MRI LUMBAR SPINE WITHOUT CONTRAST
5 of 6 series · 32 of 48 positions shown · IV contrast (gadolinium)
Comparison: None available.

﻿EXAM:  [id number]   MRI LUMBAR SPINE WITHOUT CONTRAST
INDICATION: Lower back pain for several months.
TECHNIQUE: Multiplanar multisequential MRI of the lumbar spine was performed without gadolinium contrast.

[Series 5: T2 · sagittal · 4.0mm · 0.94mm/px · 6 of 13 slices shown (1 of 3)]
[im 1/13]
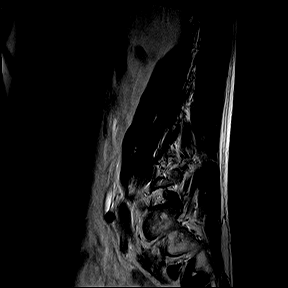
[im 3/13]
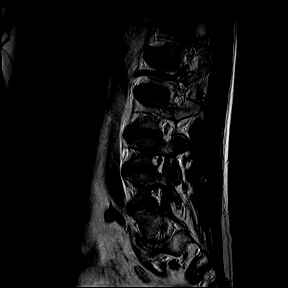
[im 5/13]
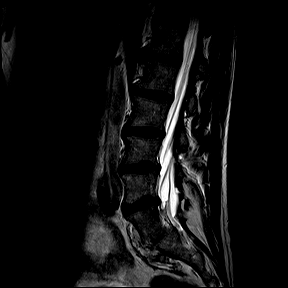
[im 8/13]
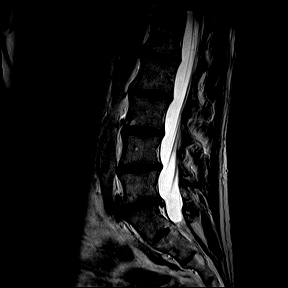
[im 10/13]
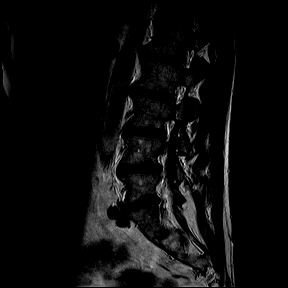
[im 13/13]
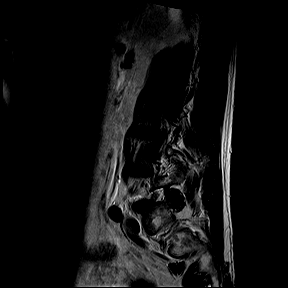

[Series 6: T1 · sagittal · 4.0mm · 0.94mm/px · 6 of 13 slices shown (1 of 2)]
[im 1/13]
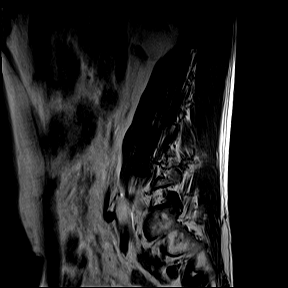
[im 3/13]
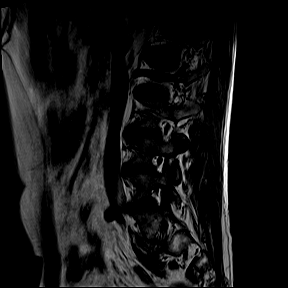
[im 5/13]
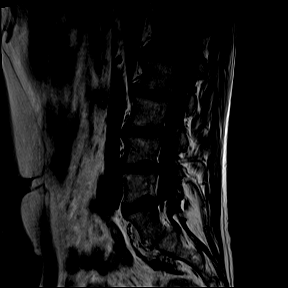
[im 8/13]
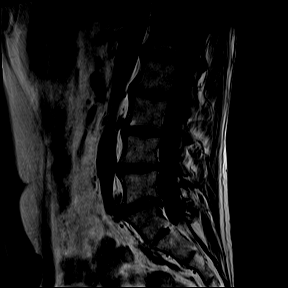
[im 10/13]
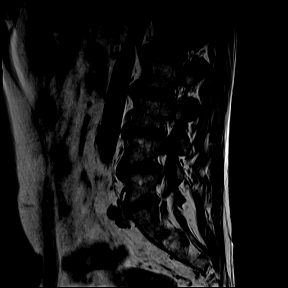
[im 13/13]
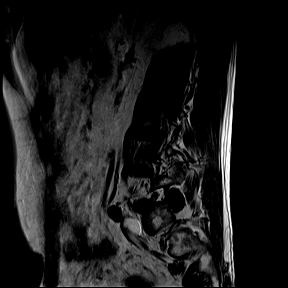

[Series 8: T2 · coronal · 5.0mm · 0.82mm/px · 8 of 18 slices shown (2 of 3)]
[im 1/18]
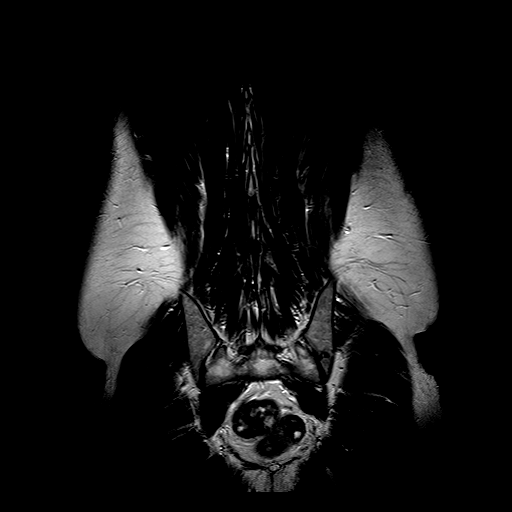
[im 3/18]
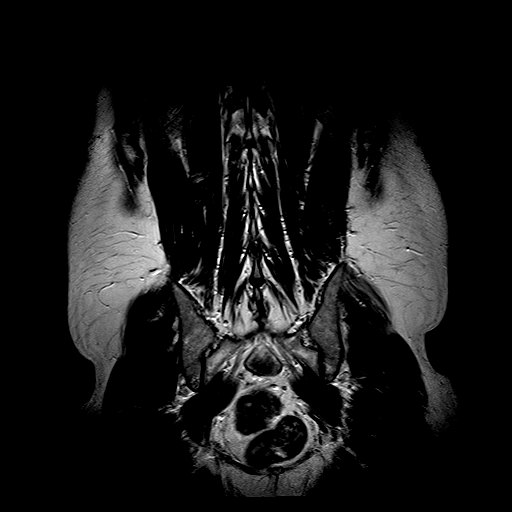
[im 5/18]
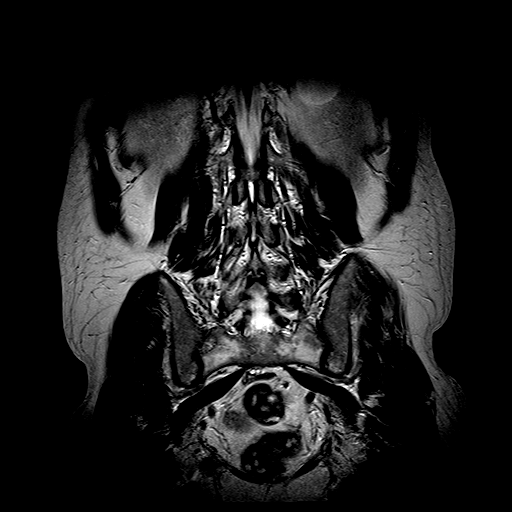
[im 8/18]
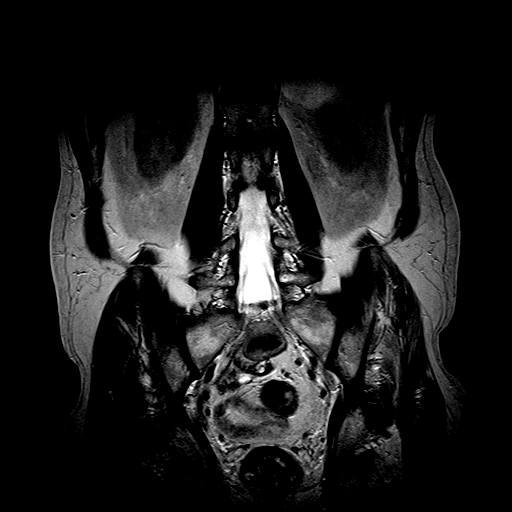
[im 10/18]
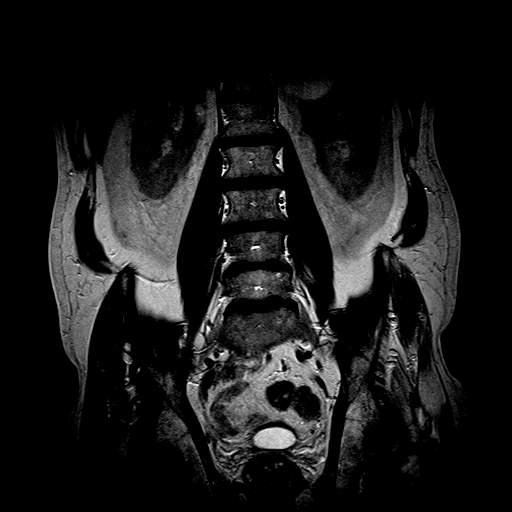
[im 13/18]
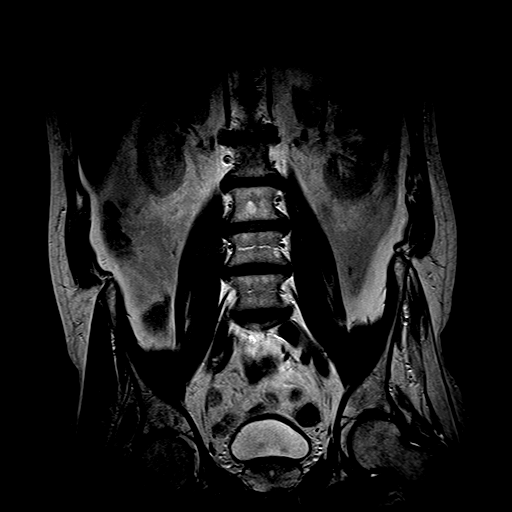
[im 15/18]
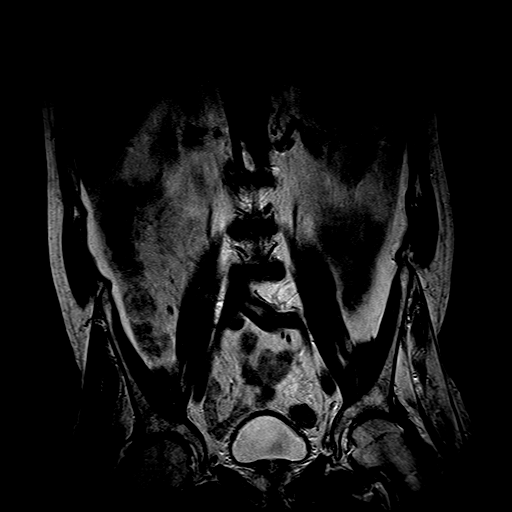
[im 18/18]
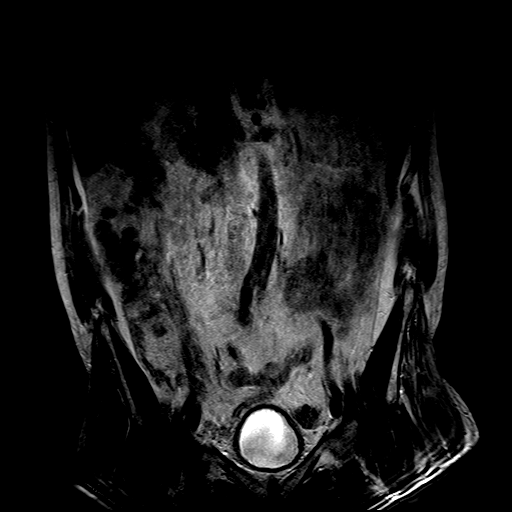

[Series 9: T2 · axial · 4.0mm · 0.52mm/px · z∈[-40,+154]mm · 11 of 23 slices shown (3 of 3)]
[im 1/23]
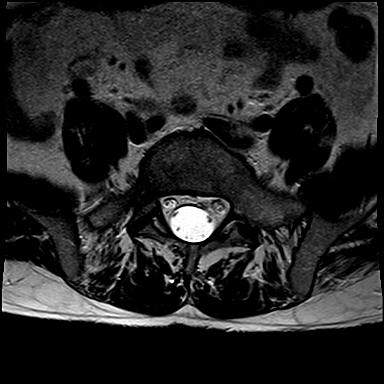
[im 3/23]
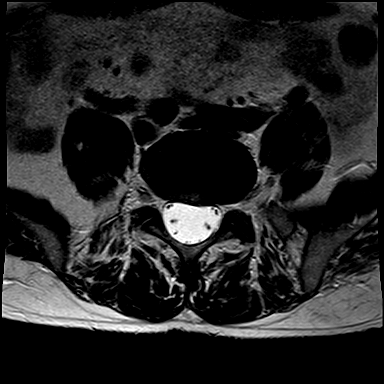
[im 5/23]
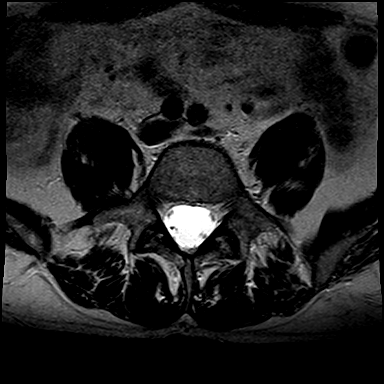
[im 7/23]
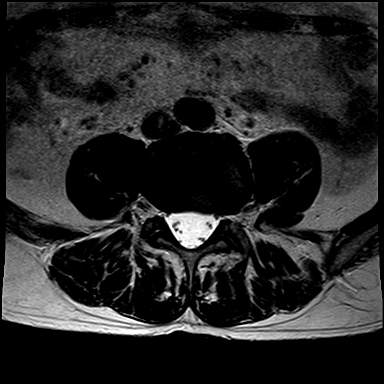
[im 9/23]
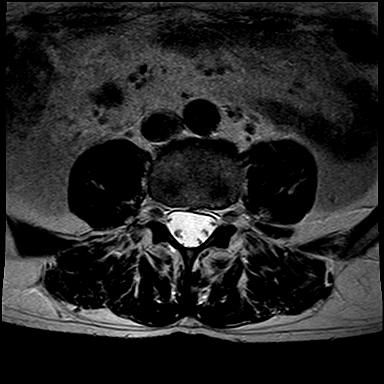
[im 12/23]
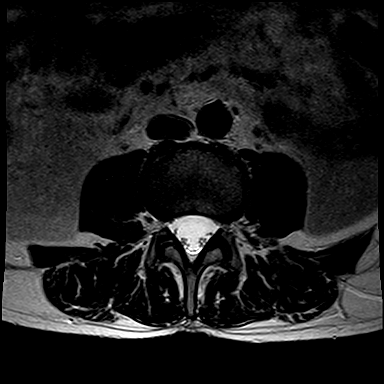
[im 14/23]
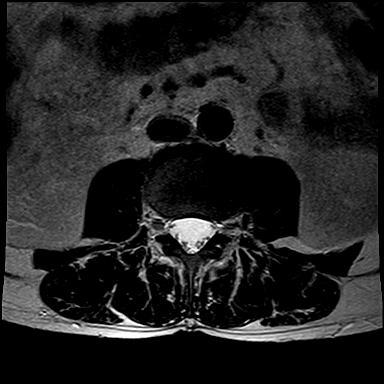
[im 16/23]
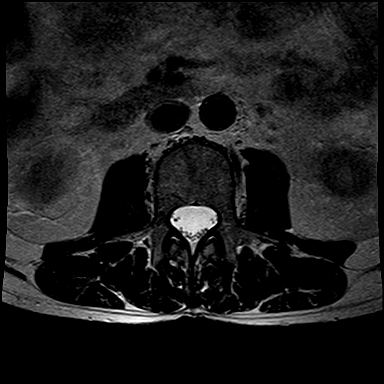
[im 18/23]
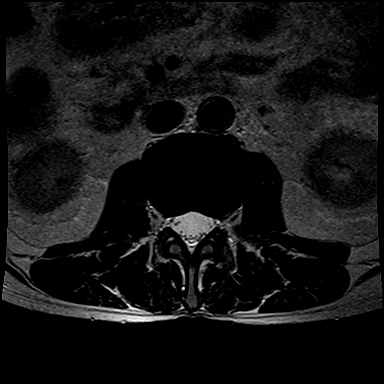
[im 20/23]
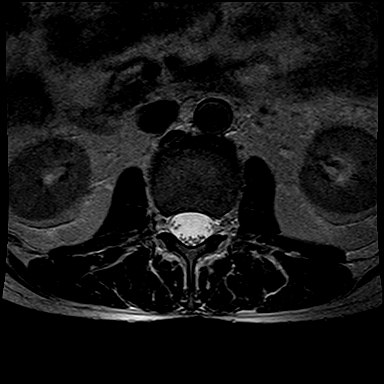
[im 23/23]
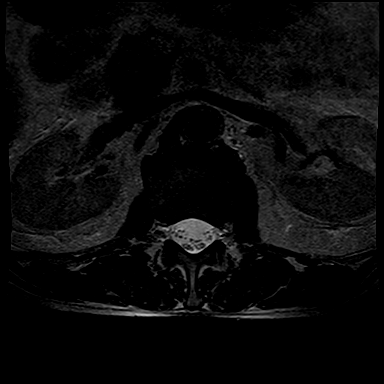

[Series 10: T1 · axial · 4.0mm · 0.52mm/px · 1 of 23 slices shown (2 of 2)]
[im 1/23]
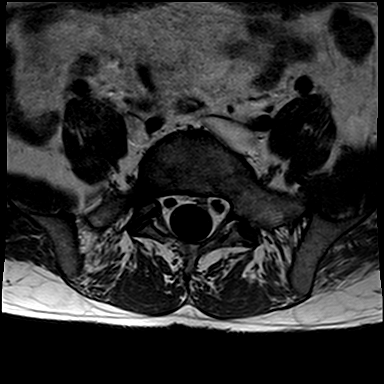

[32 of 48 positions shown; findings below may reference images not displayed]

FINDINGS: Vertebral bodies are normal in height, alignment and signal intensity. There is no acute fracture or subluxation. Distal spinal cord is normal in signal intensity and terminates normally at T12-L1 disc space level.

L1-2 level is unremarkable. 

At L2-3 level, there is a minimal bulging annulus, minimally effacing the ventral thecal sac.  There is no significant neural foraminal stenosis. 

At L3-4 level, there is a small broad-based central disc bulge mildly effacing the ventral thecal sac.  There is no significant neural foraminal stenosis. 

At L4-5 level, there is a small broad-based central and left paracentral disc bulge mildly effacing the ventral thecal sac.  There is mild right and mild-to-moderate left neural foraminal stenosis from facet arthropathy and bulging annulus. 

At L5-S1 level, there is a minimal bulging annulus, minimally abutting the ventral thecal sac. There is mild-to-moderate left and mild right neural foraminal stenosis from facet arthropathy and bulging annulus. 

Paraspinal soft tissues appear unremarkable.
IMPRESSION: 1. No significant disc herniation or spinal stenosis at any level. 

2. Multilevel neural foraminal stenosis as detailed above.

## 2022-06-09 IMAGING — DX CHEST XRAY 2 VIEWS
1 series · 3 of 3 positions shown · non-contrast
Comparison: None available.

﻿EXAM:  [id number]   CHEST XRAY 2 VIEWS
INDICATION: 70-year-old nonsmoker with unintended weight loss. .

[Series 1: PA · 0.14mm/px · 3 of 3 slices shown]
[im 1/3]
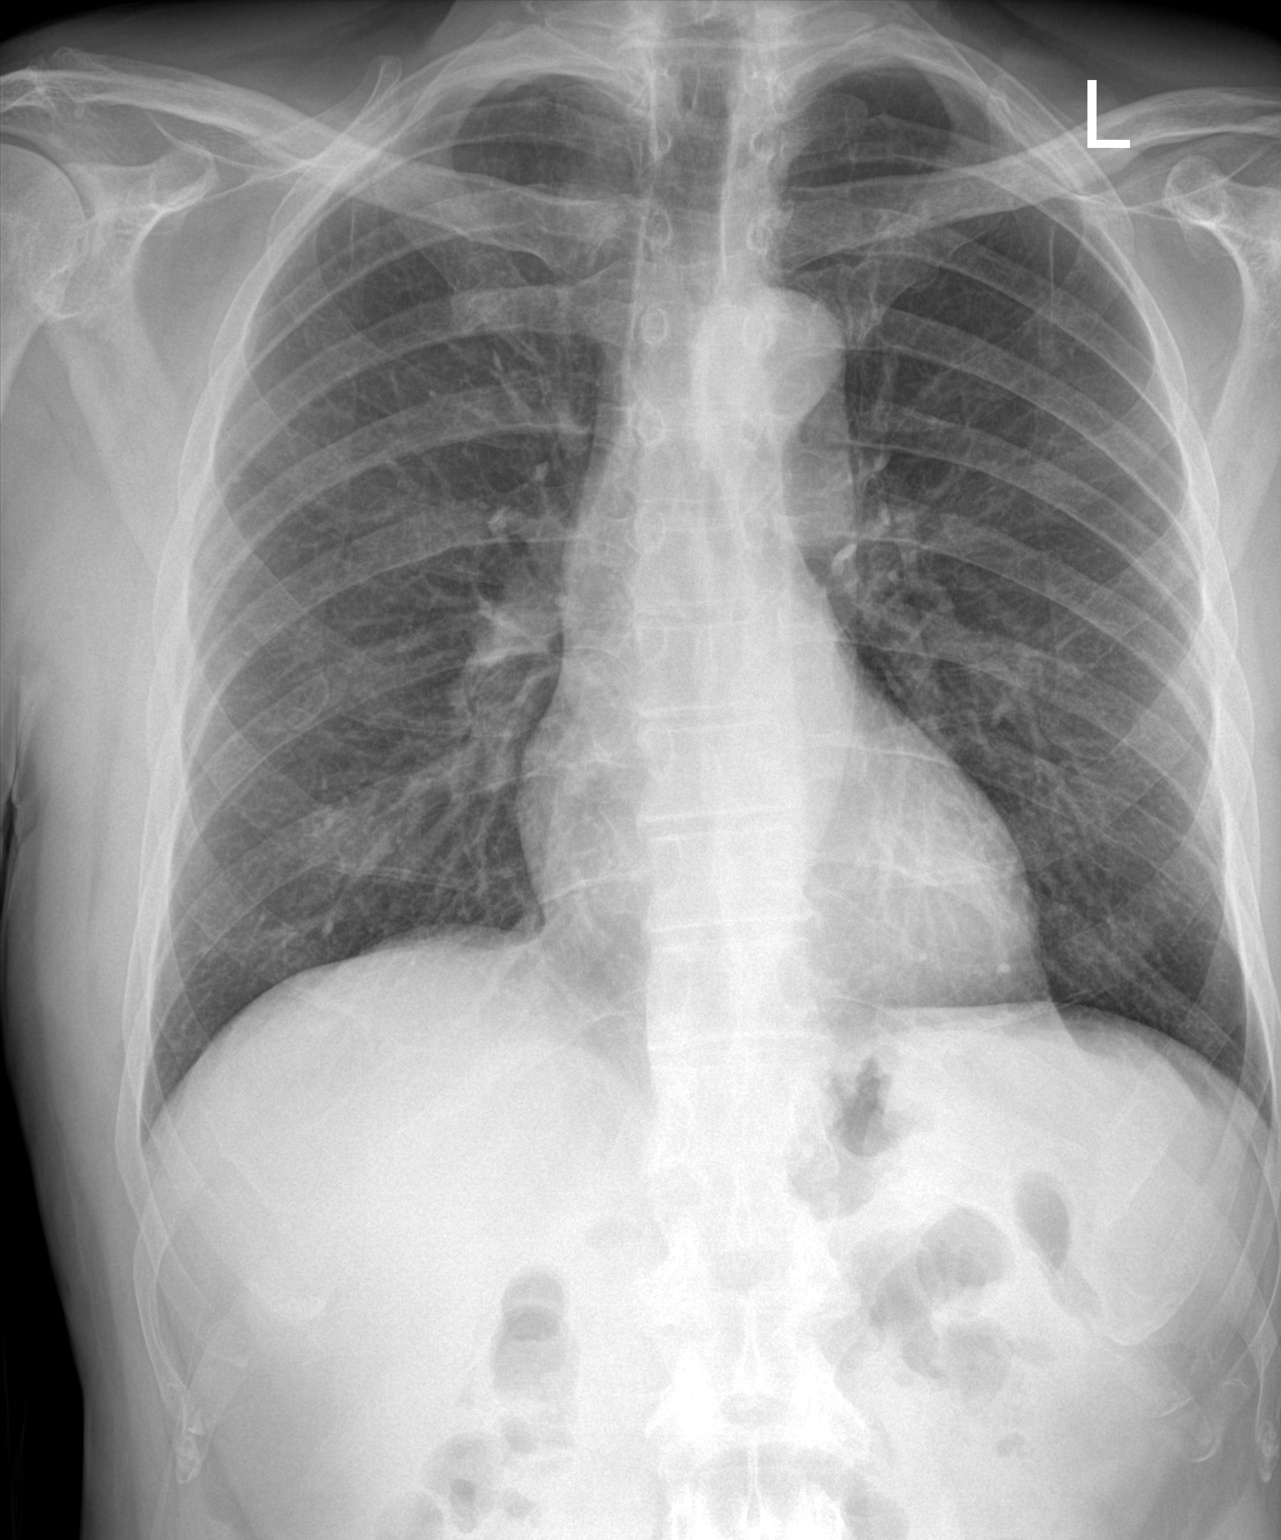
[im 2/3]
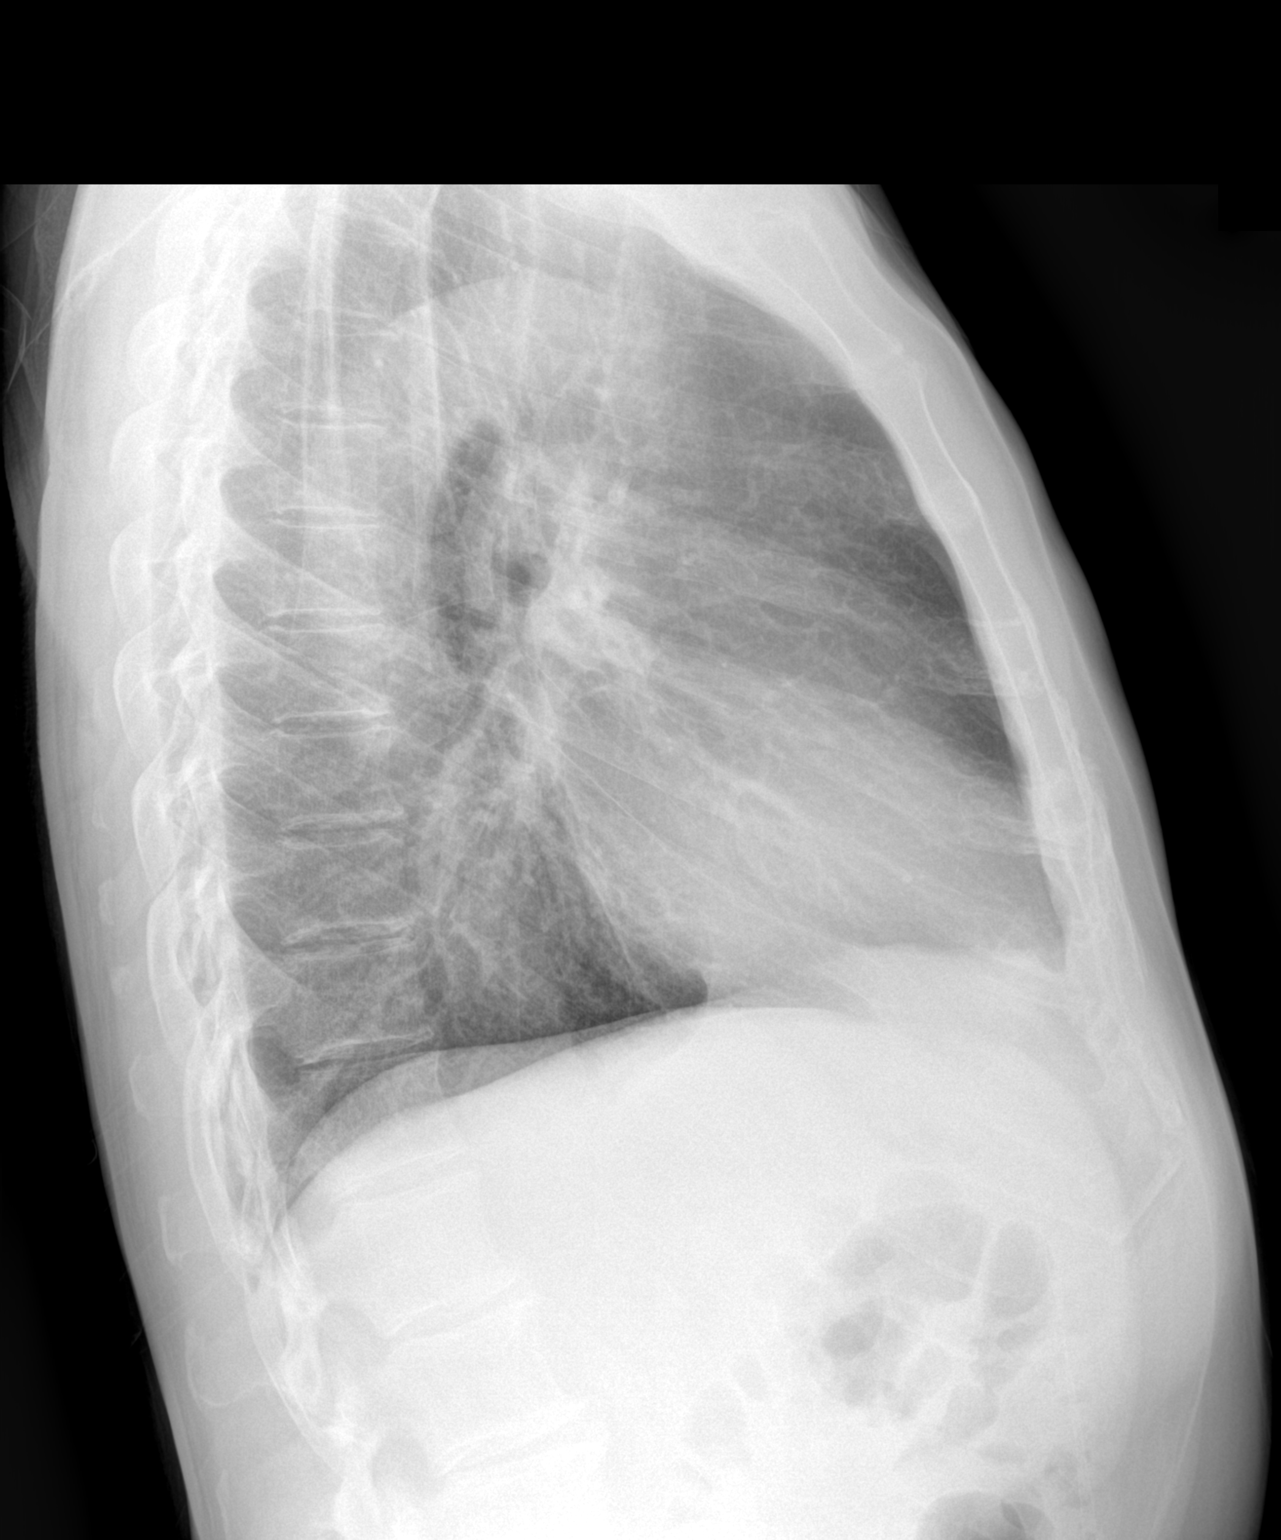
[im 3/3]
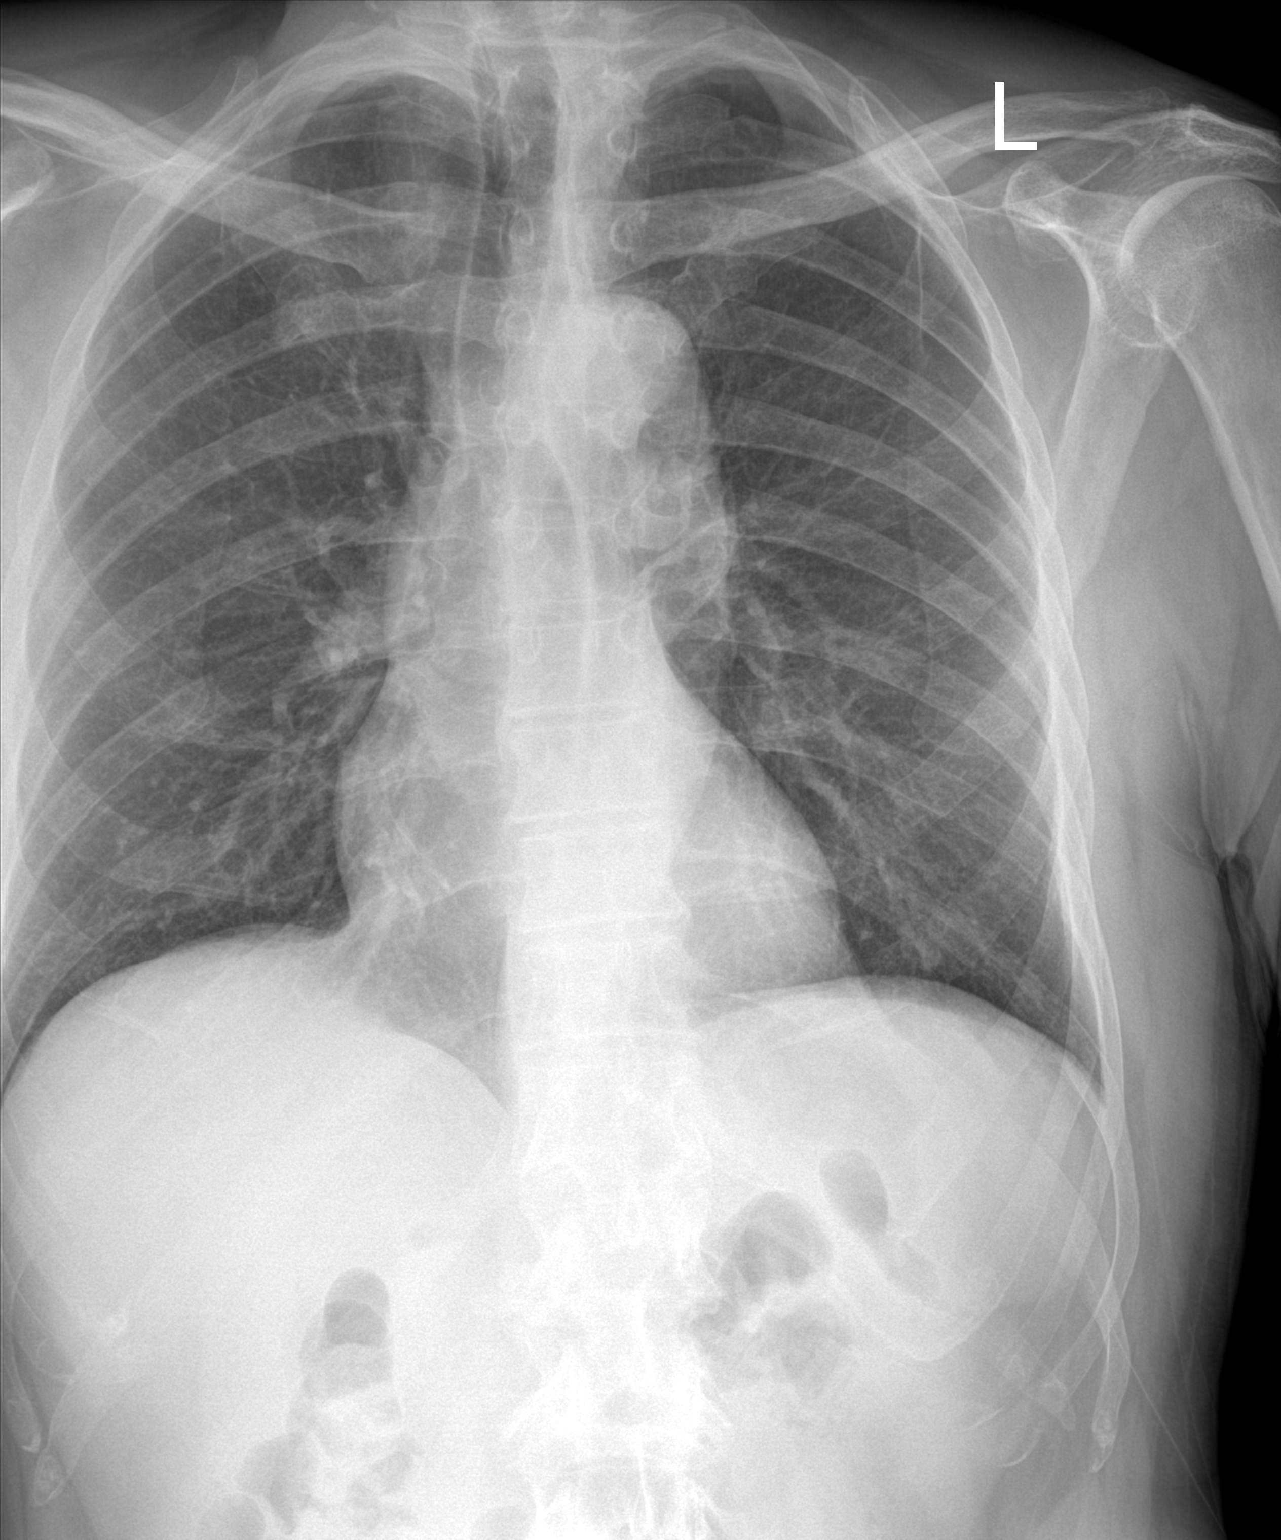

[3 of 3 positions shown; findings below may reference images not displayed]

FINDINGS: Heart size is normal. Significant atherosclerotic aorta.  Lungs are clear of acute or focal lesions.
IMPRESSION: No acute pulmonary findings. Significant atherosclerotic aorta.

## 2022-06-17 ENCOUNTER — Encounter (INDEPENDENT_AMBULATORY_CARE_PROVIDER_SITE_OTHER): Payer: Self-pay | Admitting: Surgery

## 2022-06-17 ENCOUNTER — Other Ambulatory Visit: Payer: Self-pay

## 2022-06-17 ENCOUNTER — Ambulatory Visit (INDEPENDENT_AMBULATORY_CARE_PROVIDER_SITE_OTHER): Payer: Medicare Other | Admitting: Surgery

## 2022-06-17 VITALS — BP 138/83 | HR 60 | Temp 96.6°F | Resp 18 | Ht 71.0 in | Wt 165.0 lb

## 2022-06-17 DIAGNOSIS — K219 Gastro-esophageal reflux disease without esophagitis: Secondary | ICD-10-CM

## 2022-06-17 DIAGNOSIS — R634 Abnormal weight loss: Secondary | ICD-10-CM

## 2022-06-17 MED ORDER — PEG 3350-ELECTROLYTES 236 GRAM-22.74 GRAM-6.74 GRAM-5.86 GRAM SOLUTION
4.0000 L | Freq: Once | ORAL | 0 refills | Status: AC
Start: 2022-06-17 — End: 2022-06-17

## 2022-06-20 ENCOUNTER — Encounter (INDEPENDENT_AMBULATORY_CARE_PROVIDER_SITE_OTHER): Payer: Self-pay | Admitting: Surgery

## 2022-06-20 NOTE — H&P (Signed)
Office History and Physical      Reason for Visit: Colonoscopy and EGD (Abnormal weight loss)    History of Present Illness  Mr. Besecker presents as a referral by Dr. Ferdinand Lango for evaluation of upper and lower endoscopy with history of gastroesophageal reflux disease with a 13 lb weight loss which has been unintentional.  No history of melena, hematochezia, hematemesis, change in bowel habits, or rectal bleeding.  No family history of colon cancer.  Last colonoscopy was around 2004.  Weight loss has been significant enough to cause him concern.        I have reviewed the patient's provided medical records and diagnostic testing including laboratory values, imaging results, documented encounters and providers notes with all pertinent information noted with respect to today's evaluation serving as unique tests and sources as a component of the medical decision making process for this encounter relevant to the patients independent evaluation by me today.        Patient Data  Patient History  Past Medical History:   Diagnosis Date    BPH (benign prostatic hyperplasia)     Cancer (CMS HCC)     skin cancer    Esophageal reflux          Past Surgical History:   Procedure Laterality Date    HX TONSILLECTOMY      SHOULDER SURGERY      SKIN SURGERY           Current Outpatient Medications   Medication Sig    bethanechol chloride (URECHOLINE) 25 mg Oral Tablet Take 1 Tablet (25 mg total) by mouth    omeprazole (PRILOSEC) 20 mg Oral Capsule, Delayed Release(E.C.) Take 1 Capsule (20 mg total) by mouth    SKYRIZI 150 mg/mL Subcutaneous Pen Injector every 3 (three) months    tamsulosin (FLOMAX) 0.4 mg Oral Capsule Take 1 Capsule (0.4 mg total) by mouth     No Known Allergies  Family Medical History:    None         Social History     Tobacco Use    Smoking status: Never    Smokeless tobacco: Never   Vaping Use    Vaping Use: Never used   Substance Use Topics    Alcohol use: Not Currently    Drug use: Not Currently        The  above documented section regarding past medical, past surgical, family, and social history ([place]) has been reviewed and considered and to the best of my knowledge represents a valid and accurate reflection of the patient's previous pertinent experiences documented by multiple providers and participants of the EMR.I cannot attest to all entries but do no recognize any gross inaccuracies as the data is a common field across all providers  Further history pertinent to the current encounter will be found as referenced       Physical Examination:    Vitals:    06/17/22 1440   BP: 138/83   Pulse: 60   Resp: 18   Temp: (!) 35.9 C (96.6 F)   SpO2: 99%   Weight: 74.8 kg (165 lb)   Height: 1.803 m ('5\' 11"'$ )   BMI: 23.06          General:appropriate for age. in no acute distress.  HEENT:Atraumatic, Normocephalic.   Lungs:Nonlabored breathing with symmetric expansion  Heart:Regular wth respect to rate and rythmn.    Abdomen:Soft. Nontender. Nondistended  Bowel sounds are present. No peritoneal signs  Skin:No Rashes. No  ulcers. Skin warm  Psychiatric:Alert and oriented to person, place, and time. affect appropriate        Diagnosis:    ICD-10-CM    1. Weight loss  R63.4       2. Gastroesophageal reflux disease, unspecified whether esophagitis present  K21.9           Plan:    Discussed indications, risks and benefits of colonoscopy and upper endoscopy with the patient.  Discussed the possibility of polypectomy, biopsies, and possible repeat examinations.  Risks include bleeding, sedation risks, possibility of missed diagnosis of polyp or malignancy, and remote possibilities of perforation and death.  All questions were answered and informed consent was clearly obtained      This note may have been partially generated using MModal Fluency Direct system, and there may be some incorrect words, spellings, and punctuation that were not noted in checking the note before saving, though effort was made to avoid such errors.    Reinaldo Meeker MD FACS RVT  [facility]

## 2022-06-21 ENCOUNTER — Other Ambulatory Visit: Payer: Self-pay

## 2022-06-22 IMAGING — US US THYROID
1 series · 14 of 25 positions shown · non-contrast
Comparison: None available.

﻿EXAM:  [id number]   US THYROID
INDICATION: Hypothyroidism, weight loss.

[Series 1: us thyroid · 14 of 44 slices shown]
[im 1/44]
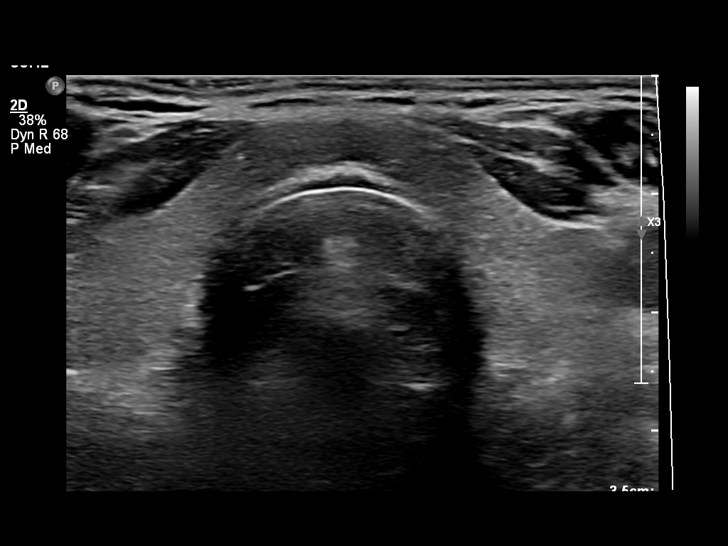
[im 4/44]
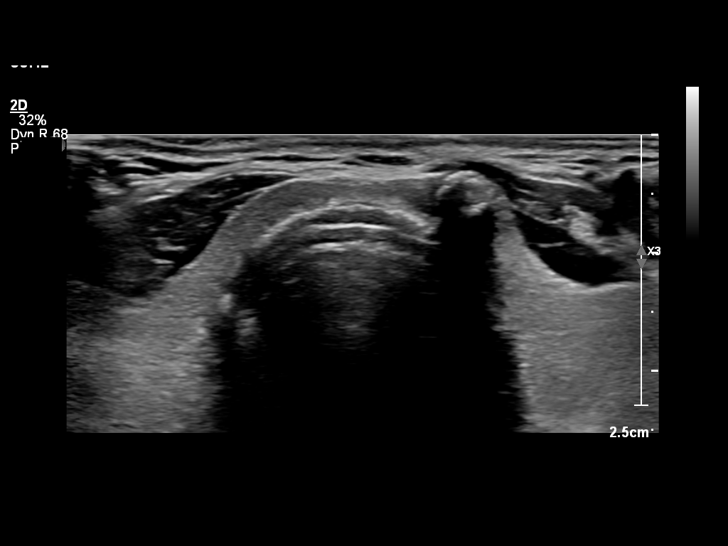
[im 8/44]
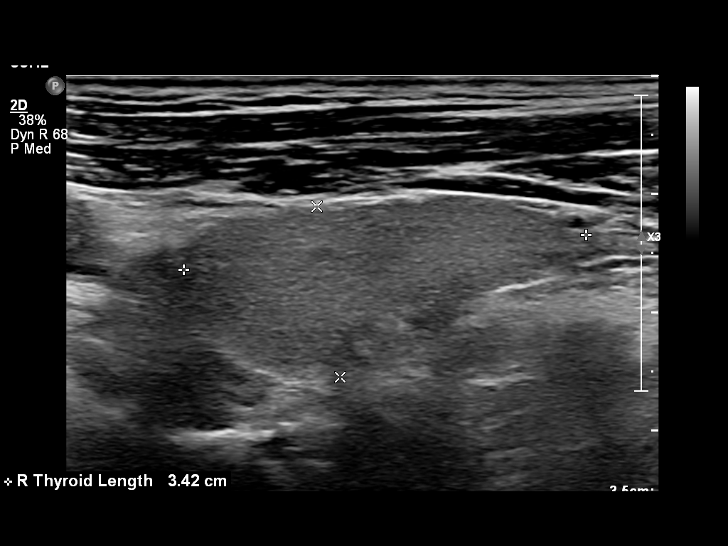
[im 11/44]
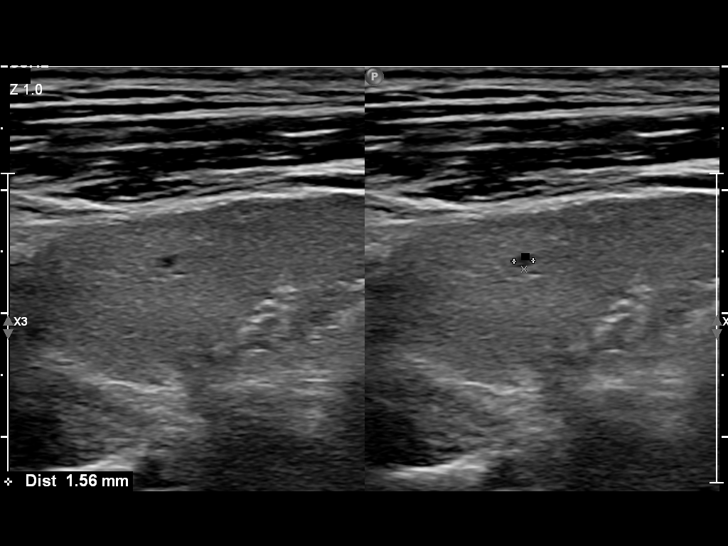
[im 15/44]
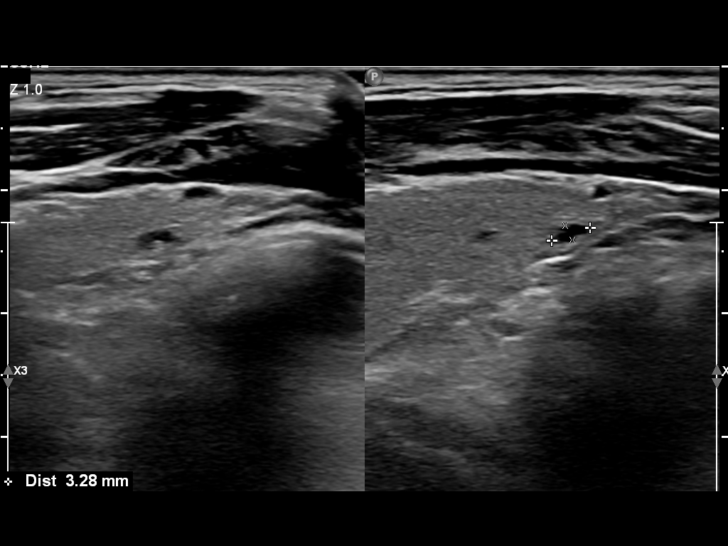
[im 17/44]
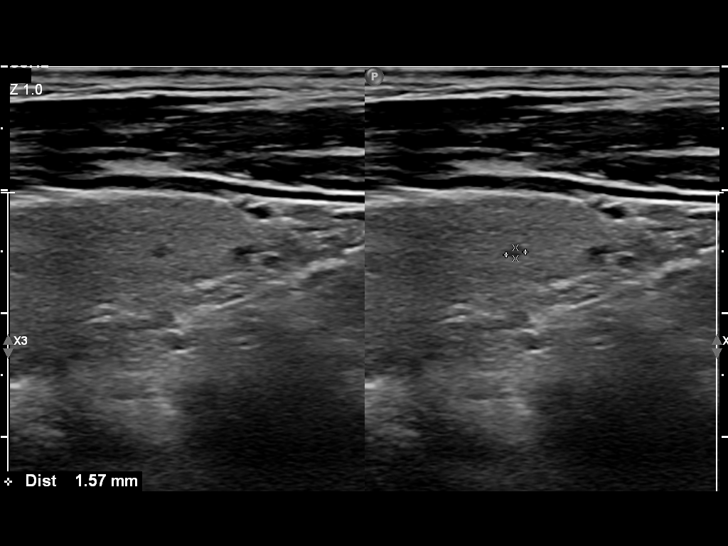
[im 20/44]
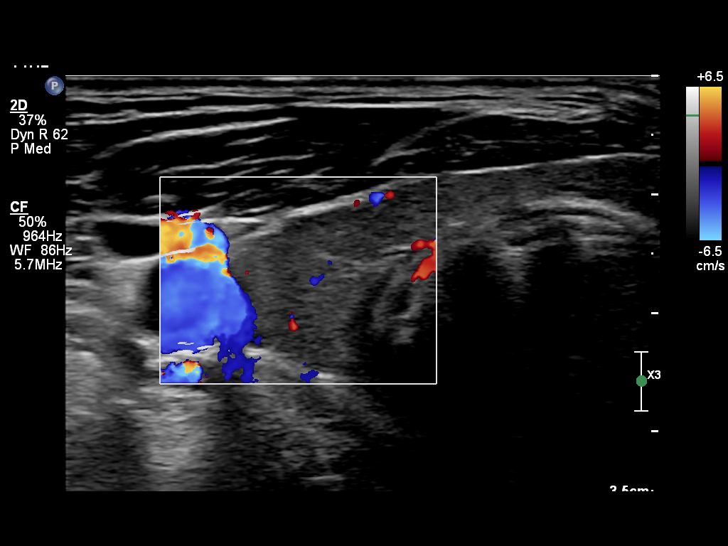
[im 24/44]
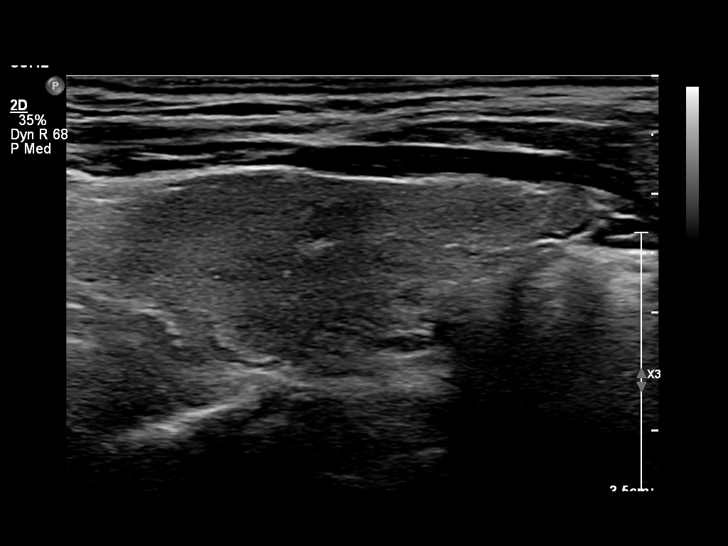
[im 27/44]
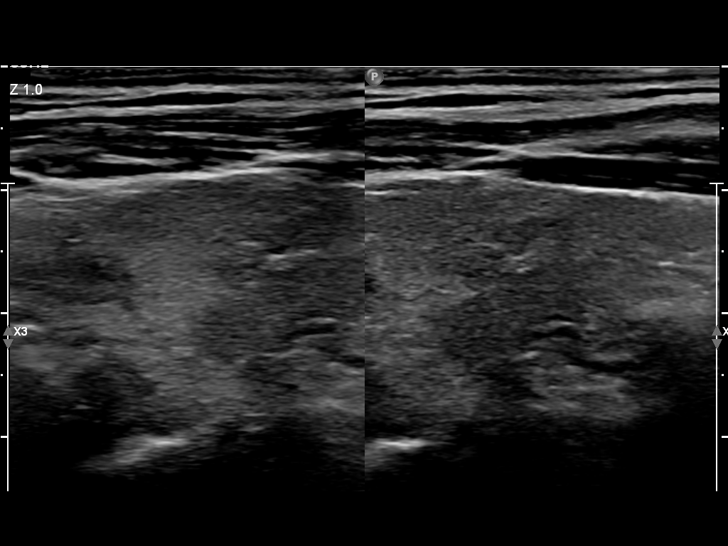
[im 29/44]
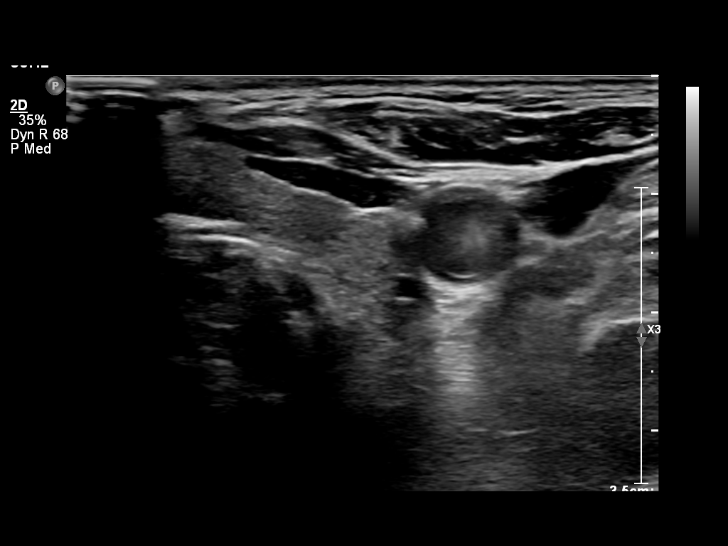
[im 33/44]
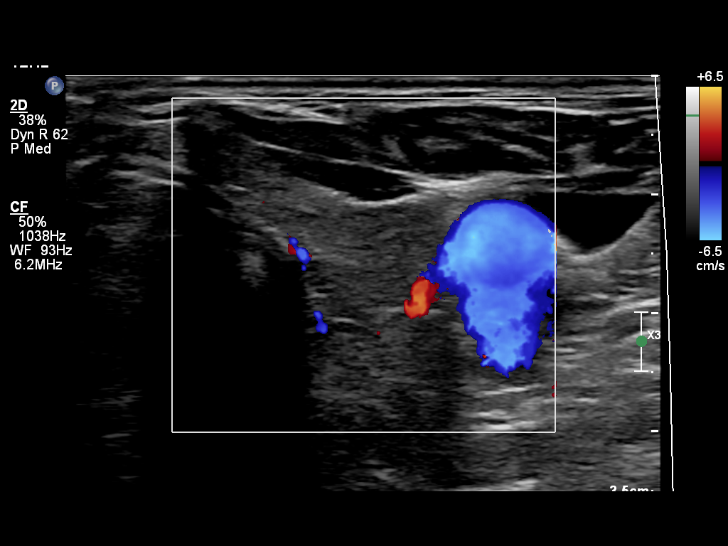
[im 36/44]
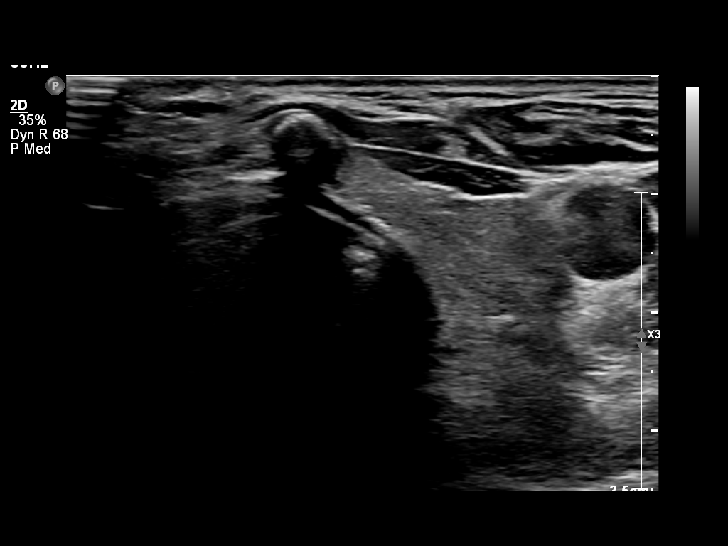
[im 40/44]
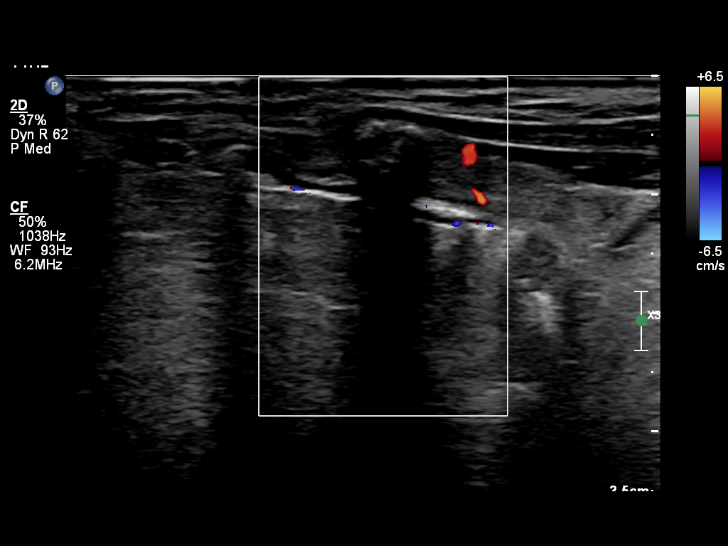
[im 44/44]
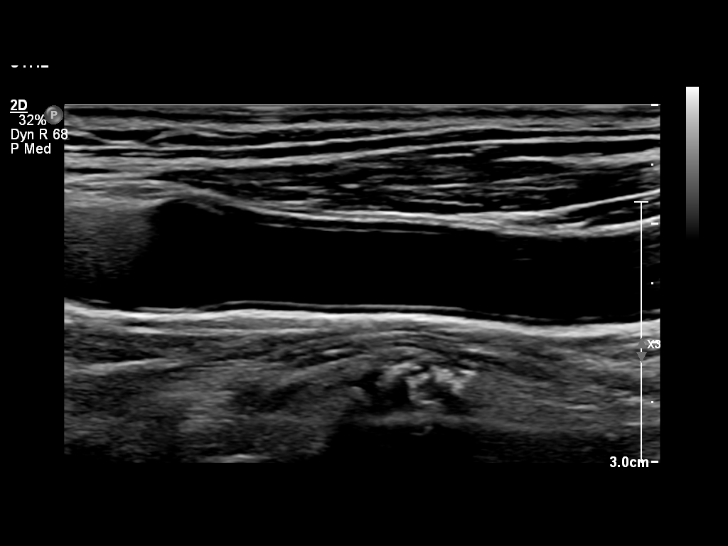

[14 of 25 positions shown; findings below may reference images not displayed]

FINDINGS: The thyroid is normal in overall size measuring 3.4 x 1.5 x 1.4 cm on the right and 3.9 x 1.7 x 1.3 cm on the left.  

There is an 8 mm partially calcified nodule in the left upper isthmus.  Tiny cysts are noted in the right thyroid lobe with a maximum diameter of 3 mm.  

Thyroid echotexture appears otherwise within normal limits.  No dominant mass is identified.
IMPRESSION: 1. A few small, benign-appearing thyroid nodules.  

2. Otherwise, unremarkable examination.

## 2022-06-30 ENCOUNTER — Other Ambulatory Visit (INDEPENDENT_AMBULATORY_CARE_PROVIDER_SITE_OTHER): Payer: Self-pay | Admitting: Surgery

## 2022-06-30 MED ORDER — PEG 3350-ELECTROLYTES 236 GRAM-22.74 GRAM-6.74 GRAM-5.86 GRAM SOLUTION
4.0000 L | Freq: Once | ORAL | 0 refills | Status: AC
Start: 2022-06-30 — End: 2022-06-30

## 2022-08-03 ENCOUNTER — Encounter (HOSPITAL_COMMUNITY)
Admission: RE | Disposition: A | Discharge status: Home / Self Care | Payer: Self-pay | Source: Ambulatory Visit | Attending: Surgery

## 2022-08-03 ENCOUNTER — Ambulatory Visit (HOSPITAL_COMMUNITY): Payer: Medicare Other | Admitting: Certified Registered"

## 2022-08-03 ENCOUNTER — Other Ambulatory Visit: Payer: Self-pay

## 2022-08-03 ENCOUNTER — Encounter (HOSPITAL_COMMUNITY): Payer: Self-pay | Admitting: Surgery

## 2022-08-03 ENCOUNTER — Inpatient Hospital Stay
Admission: RE | Admit: 2022-08-03 | Discharge: 2022-08-03 | Disposition: A | Discharge status: Home / Self Care | Payer: Medicare Other | Source: Ambulatory Visit | Attending: Surgery | Admitting: Surgery

## 2022-08-03 ENCOUNTER — Encounter (HOSPITAL_COMMUNITY): Payer: Medicare Other | Admitting: Surgery

## 2022-08-03 DIAGNOSIS — K2950 Unspecified chronic gastritis without bleeding: Secondary | ICD-10-CM | POA: Insufficient documentation

## 2022-08-03 DIAGNOSIS — R634 Abnormal weight loss: Secondary | ICD-10-CM | POA: Insufficient documentation

## 2022-08-03 DIAGNOSIS — K648 Other hemorrhoids: Secondary | ICD-10-CM | POA: Insufficient documentation

## 2022-08-03 DIAGNOSIS — K5730 Diverticulosis of large intestine without perforation or abscess without bleeding: Secondary | ICD-10-CM | POA: Insufficient documentation

## 2022-08-03 DIAGNOSIS — K449 Diaphragmatic hernia without obstruction or gangrene: Secondary | ICD-10-CM | POA: Insufficient documentation

## 2022-08-03 DIAGNOSIS — K219 Gastro-esophageal reflux disease without esophagitis: Secondary | ICD-10-CM | POA: Insufficient documentation

## 2022-08-03 DIAGNOSIS — K2970 Gastritis, unspecified, without bleeding: Secondary | ICD-10-CM

## 2022-08-03 SURGERY — GASTROSCOPY WITH BIOPSY
Anesthesia: General | Wound class: Clean Contaminated Wounds-The respiratory, GI, Genital, or urinary

## 2022-08-03 MED ORDER — LIDOCAINE (PF) 100 MG/5 ML (2 %) INTRAVENOUS SYRINGE
INJECTION | Freq: Once | INTRAVENOUS | Status: DC | PRN
Start: 2022-08-03 — End: 2022-08-03
  Administered 2022-08-03: 100 mg via INTRAVENOUS

## 2022-08-03 MED ORDER — PROPOFOL 10 MG/ML IV BOLUS
INJECTION | Freq: Once | INTRAVENOUS | Status: DC | PRN
Start: 2022-08-03 — End: 2022-08-03
  Administered 2022-08-03 (×2): 50 mg via INTRAVENOUS
  Administered 2022-08-03: 100 mg via INTRAVENOUS
  Administered 2022-08-03: 50 mg via INTRAVENOUS

## 2022-08-03 MED ORDER — LACTATED RINGERS INTRAVENOUS SOLUTION
INTRAVENOUS | Status: DC
Start: 2022-08-03 — End: 2022-08-03
  Administered 2022-08-03: 0 via INTRAVENOUS

## 2022-08-03 SURGICAL SUPPLY — 3 items
FORCEPS BIOPSY MICROMESH TTH STREAMLINE CATH NEEDLE 240CM 2.4MM RJ 4 SS LRG CPC STRL DISP ORNG 2.8MM (ENDOSCOPIC SUPPLIES) ×1 IMPLANT
FORCEPS BIOPSY NEEDLE 240CM 2._4MM RJ 4 2.8MM LRG CPC STRL (INSTRUMENTS ENDOMECHANICAL) ×1
VALVE AIR/H20 DEFENDO BUTTON KIT SUCT BIOPSY STRL DISP (ENDOSCOPIC SUPPLIES) ×1 IMPLANT

## 2022-08-03 NOTE — Anesthesia Preprocedure Evaluation (Signed)
ANESTHESIA PRE-OP EVALUATION  Planned Procedure: EGD WITH BIOPSY  COLONOSCOPY  Review of Systems     anesthesia history negative     patient summary reviewed  nursing notes reviewed        Pulmonary  negative pulmonary ROS,    Cardiovascular  negative cardio ROS,   ECG reviewed , Exercise Tolerance: > or = 4 METS        GI/Hepatic/Renal    GERD, well controlled and kidney stones        Endo/Other   neg endo/other ROS,       Neuro/Psych/MS   negative neuro/psych ROS,      Cancer    negative hematology/oncology ROS,                 Physical Assessment      Airway       Mallampati: I    TM distance: >3 FB    Mouth Opening: good.            Dental           (+) partials           Pulmonary           Cardiovascular             Other findings          Plan  ASA 2     Planned anesthesia type: general     general intravenous                  Intravenous induction       Anesthetic plan and risks discussed with patient  signed consent obtained            Patient's NPO status is appropriate for Anesthesia.

## 2022-08-03 NOTE — Discharge Instructions (Signed)
SURGICAL DISCHARGE INSTRUCTIONS     Dr. Georgiann Cocker, Gaston Islam, MD  performed your EGD WITH BIOPSY, COLONOSCOPY today at the [facility]  Findings: EGD: Gastritis, Hiatal Hernia  COLONOSCOPY: Diverticulosis, Hemorrhoids     [place]:  Monday through Friday from 8 a.m. - 4 p.m.: [phone]    For T&D: [phone]  Between 4 p.m. - 8 a.m., weekends and holidays:  Call ER [phone]    PLEASE SEE WRITTEN HANDOUTS AS DISCUSSED BY YOUR NURSE:  Delmore Sear, RN      ANESTHESIA INFORMATION   ANESTHESIA -- ADULT PATIENTS:  You have received intravenous sedation / general anesthesia, and you may feel drowsy and light-headed for several hours. You may even experience some forgetfulness of the procedure. DO NOT DRIVE A MOTOR VEHICLE or perform any activity requiring complete alertness or coordination until you feel fully awake in about 24-48 hours. Do not drink alcoholic beverages for at least 24 hours. Do not stay alone, you must have a responsible adult available to be with you. You may also experience a dry mouth or nausea for 24 hours. This is a normal side effect and will disappear as the effects of the medication wear off.    REMEMBER   If you experience any difficulty breathing, chest pain, bleeding that you feel is excessive, persistent nausea or vomiting or for any other concerns:  Call your physician Dr. Georgiann Cocker, Gaston Islam, MD at [phone] . You may also ask to have the general doctor on call paged. They are available to you 24 hours a day.      SPECIAL INSTRUCTIONS / COMMENTS   Drink plenty of water and eat a high-fiber diet to help with Diverticulosis.

## 2022-08-03 NOTE — OR Surgeon (Signed)
[facility]    OPERATIVE NOTE    Patient Name: Bobby Flynn, Bobby Flynn MRN:: [id number]  Date of Birth: 02-11-1951  Date of Service: 08/03/2022     Pre-Operative Diagnosis: ABNORMAL WEIGHT LOSS;GERD     Post-Operative Diagnosis:   Mild gastritis  Small hiatal hernia   Internal hemorrhoids  Sigmoid diverticulosis    Procedure(s)/Description:  EGD WITH BIOPSY: 43239 (CPT)  COLONOSCOPY: 10626 (CPT)     Attending Surgeon: Reinaldo Meeker, MD     Anesthesia Staff:  CRNA: Ardelle Anton, CRNA    Anesthesia Type: .General     Estimated Blood Loss:  minimal    Specimens Removed:   ID Type Source Tests Collected by Time Destination   1 : gastric antrum bx x1 Tissue Gastric SURGICAL PATHOLOGY SPECIMEN Reinaldo Meeker, MD 08/03/2022 [phone]       Order Name Source Comment Collection Info Order Time   SURGICAL PATHOLOGY SPECIMEN Gastric Pre-op diagnosis:  ABNORMAL WEIGHT LOSS;GERD Collected By: Reinaldo Meeker, MD 08/03/2022  7:25 AM     Release to patient   Automated             Complications:  None    Condition:  Stable    Disposition:   PACU - hemodynamically stable.        Intraoperative Findings:     The Olympus gastroscope was brought to the operating field gently inserted into the patient's oropharynx and advanced to the level of the second and third portions of the duodenum under direct visualization without difficulty.  Once this anatomic landmark was reached the scope was slowly retracted with circumferential visualization of all upper intestinal walls with findings of gastritis and a small hiatal hernia.  There was no evidence of gastric or duodenal ulcers, polyps, or AV malformations.  Retroflexion of the scope revealed a small hiatal hernia.  The distal esophagus revealed no significant esophagitis with an intact Z line and no evidence of Barrett's changes.  The remainder of the esophagus was within normal limits without evidence of esophageal stricture or mass.  No esophageal varices.          Once the upper endoscopy was performed the Olympus colonoscope was brought to the operating field gently inserted into the patient's rectum and advanced to the level of the cecum under direct visualization without difficulty.  Once this anatomic landmark was reached the scope was slowly retracted with circumferential visualization of all colonic and rectal walls with findings of internal hemorrhoids and sigmoid diverticulosis.  There was no evidence of colonic or rectal polyps, tumors or AV malformations.   The colonic and rectal mucosa revealed no significant abnormalities where visualized.  Retroflexion of the scope revealed the presence of internal hemorrhoids. The prep was overall adequate for diagnosstic exam however very small abnormalities may be missed given the nature of the exam.          Description of Procedure           The patient was brought to the Operating Suite and placed in the supine position on the operating table.  Anesthesia/nursing personnel provided IV access as well as hemodynamic monitoring.  After appropriate lines and leads were placed, the patient was placed in the left lateral decubitus position where they received total IV anesthesia.   After the patient was deemed comfortable, the Olympus gastroscope was brought into the operative field, inserted into the patient's oropharynx, and advanced to the level of the esophagus where the esophagus  was intubated without difficulty.  The scope was then passed down the esophagus into the stomach.  The stomach was insufflated with air followed by identification of the antrum of the stomach.  The pylorus of the stomach was identified followed by passage of the scope through the pylorus to the second and third portions of the duodenum without difficulty.  Once this anatomic location had been reached, the scope was slowly retracted with circumferential visualization of all upper intestinal walls with findings as dictated above.  The scope was  brought back to the antrum of the stomach where antral biopsy for Helicobacter pylori was accomplished followed by retroflexion of the scope to evaluate the fundus, body, and cardia of the stomach with findings as noted.  The scope was straightened and the stomach decompressed of as much air as possible.  The scope removed back to the GE junction which was inspected thoroughly.  The scope was then slowly retracted through the remainder of the esophagus.             After completion of the upper endoscopy, attention was directed to the perineum where the Olympus colonoscope was brought to the operative field, gently inserted into the patient's rectum, and advanced to the level of the cecum under direct visualization with identification of the cecum through the use of normal anatomic landmarks.   Once the cecum was clearly identified, the scope was slowly retracted with circumferential visualization of all colonic and rectal walls with findings dictated above. In the rectum  the scope was retroflexed to evaluate the anorectal junction for the presence of hemorrhoids.  The scope was slowly straightened, the colon decompressed of as much air as possible and removed without difficulty.  The patient was returned to the post anesthesia care unit in stable condition, having tolerated the procedure well.           Disposition:     Repeat colonoscopy in 10 years barring any change in symptoms    Reinaldo Meeker MD FACS RVT  [facility]

## 2022-08-03 NOTE — H&P (Signed)
General Surgery  History and Physical    Date of Service:    Vedanth, Sirico, 71 y.o. male  Date of Admission:  08/03/2022  Date of Birth:  1951/06/09  PCP: Peri Maris, DO    Reason for admission:  Upper and lower endoscopy    HPI:  Bobby Flynn is a 71 y.o. White male presenting for upper and lower endoscopy secondary to a history of gastroesophageal reflux disease and unintentional weight loss  Past Medical History:   Diagnosis Date    BPH (benign prostatic hyperplasia)     Cancer (CMS HCC)     skin cancer    Esophageal reflux       Past Surgical History:   Procedure Laterality Date    HX TONSILLECTOMY      SHOULDER SURGERY      SKIN SURGERY        Social History     Tobacco Use    Smoking status: Never    Smokeless tobacco: Never   Vaping Use    Vaping Use: Never used   Substance Use Topics    Alcohol use: Not Currently     Comment: SOCIAL    Drug use: Not Currently       Family Medical History:    None        Medications Prior to Admission       Prescriptions    bethanechol chloride (URECHOLINE) 25 mg Oral Tablet    Take 1 Tablet (25 mg total) by mouth    omeprazole (PRILOSEC) 20 mg Oral Capsule, Delayed Release(E.C.)    Take 1 Capsule (20 mg total) by mouth    SKYRIZI 150 mg/mL Subcutaneous Pen Injector    every 3 (three) months    tamsulosin (FLOMAX) 0.4 mg Oral Capsule    Take 1 Capsule (0.4 mg total) by mouth           '@ALLERGY'$        Patient Vitals for the past 24 hrs:   BP Temp Pulse Resp SpO2 Height Weight   08/03/22 0651 133/76 36.9 C (98.4 F) 61 17 96 % 1.778 m ('5\' 10"'$ ) 72.6 kg (160 lb)          General: appropriate for age. in no acute distress.    HEENT: Atraumatic, Normocephalic.    Lungs: Nonlabored breathing with symmetric expansion    Heart:Regular wth respect to rate and rythmn.    Abdomen:Soft. Nontender. Nondistended     Psychiatric: Alert and oriented to person, place, and time. affect appropriate    Laboratory Data:     No results found for any visits on 08/03/22 (from the past 24  hour(s)).    Imaging Studies:    No orders to display        Assessment:  Weight loss   Gastroesophageal reflux disease     Plan:    Discussed indications, risks and benefits of colonoscopy and upper endoscopy with the patient.  Discussed the possibility of polypectomy, biopsies, and possible repeat examinations.  Risks include bleeding, sedation risks, possibility of missed diagnosis of polyp or malignancy, and remote possibilities of perforation and death.  All questions were answered and informed consent was clearly obtained    This note was partially created using voice recognition software and is inherently subject to errors including those of syntax and "sound alike " substitutions which may escape proof reading. In such instances, original meaning may be extrapolated by contextual derivation.    Gaston Islam  Madora Barletta MD FACS RVT  [facility]

## 2022-08-03 NOTE — Anesthesia Postprocedure Evaluation (Signed)
Anesthesia Post Op Evaluation    Patient: Bobby Flynn  Procedure(s):  EGD WITH BIOPSY  COLONOSCOPY    Last Vitals:Temperature: 36.9 C (98.4 F) (08/03/22 0651)  Heart Rate: 61 (08/03/22 0651)  BP (Non-Invasive): 133/76 (08/03/22 0651)  Respiratory Rate: 17 (08/03/22 0651)  SpO2: 96 % (08/03/22 0651)    No notable events documented.    Patient is sufficiently recovered from the effects of anesthesia to participate in the evaluation and has returned to their pre-procedure level.  Patient location during evaluation: PACU       Patient participation: complete - patient participated  Level of consciousness: awake and alert and responsive to verbal stimuli    Pain management: adequate  Airway patency: patent    Anesthetic complications: no  Cardiovascular status: acceptable  Respiratory status: acceptable  Hydration status: acceptable  Patient post-procedure temperature: Pt Normothermic   PONV Status: Absent

## 2022-08-04 DIAGNOSIS — K2950 Unspecified chronic gastritis without bleeding: Secondary | ICD-10-CM

## 2022-08-04 LAB — SURGICAL PATHOLOGY SPECIMEN

## 2022-11-16 ENCOUNTER — Encounter (HOSPITAL_COMMUNITY): Payer: Self-pay

## 2022-11-16 ENCOUNTER — Other Ambulatory Visit (HOSPITAL_COMMUNITY): Payer: Self-pay | Admitting: Family Medicine

## 2022-11-16 DIAGNOSIS — Q619 Cystic kidney disease, unspecified: Secondary | ICD-10-CM

## 2022-12-30 ENCOUNTER — Inpatient Hospital Stay
Admission: RE | Admit: 2022-12-30 | Discharge: 2022-12-30 | Disposition: A | Discharge status: Home / Self Care | Payer: Medicare Other | Source: Ambulatory Visit | Attending: Family Medicine

## 2022-12-30 ENCOUNTER — Other Ambulatory Visit: Payer: Self-pay

## 2022-12-30 DIAGNOSIS — Q619 Cystic kidney disease, unspecified: Secondary | ICD-10-CM | POA: Insufficient documentation

## 2023-05-19 ENCOUNTER — Encounter (INDEPENDENT_AMBULATORY_CARE_PROVIDER_SITE_OTHER): Payer: Self-pay | Admitting: OTOLARYNGOLOGY

## 2023-05-19 ENCOUNTER — Ambulatory Visit (INDEPENDENT_AMBULATORY_CARE_PROVIDER_SITE_OTHER): Payer: Medicare Other | Admitting: OTOLARYNGOLOGY

## 2023-05-19 ENCOUNTER — Other Ambulatory Visit: Payer: Self-pay

## 2023-05-19 VITALS — Ht 70.0 in | Wt 160.0 lb

## 2023-05-19 DIAGNOSIS — H9190 Unspecified hearing loss, unspecified ear: Secondary | ICD-10-CM

## 2023-05-19 DIAGNOSIS — T162XXA Foreign body in left ear, initial encounter: Secondary | ICD-10-CM

## 2023-05-19 NOTE — H&P (Signed)
ENT, [facility]  [address]  [place] [address]    History and Physical    Name: Bobby Flynn MRN:  [id number]   Date: 05/19/2023 DOB:  Nov 14, 1951 (72 y.o.)            New Patient      Chief Complaint:    Chief Complaint   Patient presents with    Foreign Body in Ear     Complains of FB in left ear. Present since last night.       HPI:  Bobby Flynn is a 72 y.o. male presenting for a new patient visit. Patient states that he had hearing aid piece dislodge in his ear last night.  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      Past Medical History:   Diagnosis Date    BPH (benign prostatic hyperplasia)     Cancer (CMS HCC)     skin cancer    Esophageal reflux          Past Surgical History:   Procedure Laterality Date    HX TONSILLECTOMY      SHOULDER SURGERY      SKIN SURGERY           Family Medical History:    None         Social History     Tobacco Use    Smoking status: Never    Smokeless tobacco: Never   Vaping Use    Vaping status: Never Used   Substance Use Topics    Alcohol use: Not Currently     Comment: SOCIAL    Drug use: Not Currently        Medications:  Current Outpatient Medications   Medication Sig    bethanechol chloride (URECHOLINE) 25 mg Oral Tablet Take 1 Tablet (25 mg total) by mouth    CELEBREX 200 mg Oral Capsule     omeprazole (PRILOSEC) 20 mg Oral Capsule, Delayed Release(E.C.) Take 1 Capsule (20 mg total) by mouth    SKYRIZI 150 mg/mL Subcutaneous Pen Injector every 3 (three) months    tamsulosin (FLOMAX) 0.4 mg Oral Capsule Take 1 Capsule (0.4 mg total) by mouth       Allergies:  No Known Allergies    Review of Systems:  Review of Systems   Constitutional: Negative.         Physical Exam:  Vitals:    05/19/23 0925   Weight: 72.6 kg (160 lb)   Height: 1.778 m (5\' 10" )   BMI: 23.01      ENT Physical Exam  Constitutional  Appearance: patient appears well-developed, well-nourished and well-groomed,  Communication/Voice: communication appropriate for developmental  age; vocal quality normal;  Head and Face  Appearance: head appears normal, face appears normal and face appears atraumatic;  Palpation: facial palpation normal;  Salivary: glands normal;  Ear  Hearing: intact;  Auricles: right auricle normal; left auricle normal;  External Mastoids: right external mastoid normal; left external mastoid normal;  Ear Canals: right ear canal normal; left ear canal normal;  Tympanic Membranes: right tympanic membrane normal; left tympanic membrane normal;  Ear comments: FB As  Nose  External Nose: nares patent bilaterally; external nose normal;  Internal Nose: nasal mucosa normal; septum normal; bilateral inferior turbinates normal;  Oral Cavity/Oropharynx  Lips: normal;  Teeth: normal;  Gums: gingiva normal;  Tongue: normal;  Oral mucosa: normal;  Hard palate: normal;  Soft palate: normal;  Tonsils: normal;  Base  of Tongue: normal;  Posterior pharyngeal wall: normal;  Neck  Neck: neck normal; neck palpation normal;  Thyroid: thyroid normal;  Respiratory  Inspection: breathing unlabored; normal breathing rate;  Lymphatic  Palpation: lymph nodes normal;  Neurovestibular  Mental Status: alert and oriented;  Psychiatric: mood normal; affect is appropriate;  Cranial Nerves: cranial nerves intact;       Assessment and Plan:    ICD-10-CM    1. Foreign body of left ear, initial encounter  T16.2XXA 69200 - REMOVAL FOREIGN BODY-EXTERNAL AUDITORY CANAL (AMB ONLY-PD)      2. Hearing loss, unspecified hearing loss type, unspecified laterality  H91.90         Orders Placed This Encounter    69200 - REMOVAL FOREIGN BODY-EXTERNAL AUDITORY CANAL (AMB ONLY-PD)    FB removed  Patient wishes to follow up prn    Follow Up:  Return if symptoms worsen or fail to improve.     Conchita Paris, DO

## 2023-05-19 NOTE — Procedures (Signed)
ENT, [facility]  [address]  [place] [address]    Procedure Note    Name: Bobby Flynn MRN:  [id number]   Date: 05/19/2023 DOB:  05/05/1951 (71 y.o.)         69200 - REMOVAL FOREIGN BODY-EXTERNAL AUDITORY CANAL (AMB ONLY-PD)    Performed by: Conchita Paris, DO  Authorized by: Conchita Paris, DO    Time Out:     Immediately before the procedure, a time out was called:  Yes    Patient verified:  Yes    Procedure Verified:  Yes    Site Verified:  Yes  Documentation:      Left ear FB removed with microscope and alligators.        Conchita Paris, DO

## 2024-03-06 ENCOUNTER — Ambulatory Visit (INDEPENDENT_AMBULATORY_CARE_PROVIDER_SITE_OTHER): Admitting: Physician Assistant

## 2024-04-10 ENCOUNTER — Encounter (INDEPENDENT_AMBULATORY_CARE_PROVIDER_SITE_OTHER): Payer: Self-pay

## 2024-04-26 ENCOUNTER — Ambulatory Visit (INDEPENDENT_AMBULATORY_CARE_PROVIDER_SITE_OTHER): Payer: Self-pay | Admitting: Student in an Organized Health Care Education/Training Program

## 2024-05-21 ENCOUNTER — Ambulatory Visit (INDEPENDENT_AMBULATORY_CARE_PROVIDER_SITE_OTHER): Admitting: Physician Assistant

## 2024-05-21 ENCOUNTER — Other Ambulatory Visit: Payer: Self-pay

## 2024-05-21 VITALS — BP 148/83 | HR 61 | Ht 70.0 in | Wt 169.0 lb

## 2024-05-21 DIAGNOSIS — Z8052 Family history of malignant neoplasm of bladder: Secondary | ICD-10-CM

## 2024-05-21 DIAGNOSIS — N281 Cyst of kidney, acquired: Secondary | ICD-10-CM

## 2024-05-21 DIAGNOSIS — N401 Enlarged prostate with lower urinary tract symptoms: Secondary | ICD-10-CM

## 2024-05-21 DIAGNOSIS — D1771 Benign lipomatous neoplasm of kidney: Secondary | ICD-10-CM

## 2024-05-21 DIAGNOSIS — R351 Nocturia: Secondary | ICD-10-CM

## 2024-05-21 DIAGNOSIS — D179 Benign lipomatous neoplasm, unspecified: Secondary | ICD-10-CM

## 2024-05-21 DIAGNOSIS — N400 Enlarged prostate without lower urinary tract symptoms: Secondary | ICD-10-CM

## 2024-05-21 DIAGNOSIS — N319 Neuromuscular dysfunction of bladder, unspecified: Secondary | ICD-10-CM

## 2024-05-21 MED ORDER — TAMSULOSIN 0.4 MG CAPSULE
0.4000 mg | ORAL_CAPSULE | Freq: Every day | ORAL | 3 refills | Status: AC
Start: 2024-05-21 — End: ?

## 2024-05-21 MED ORDER — BETHANECHOL CHLORIDE 25 MG TABLET
25.0000 mg | ORAL_TABLET | Freq: Every day | ORAL | 3 refills | Status: AC
Start: 2024-05-21 — End: ?

## 2024-05-21 NOTE — Progress Notes (Signed)
 [facility]                                                                        Urology Clinic       [name], 73 y.o. male  Encounter Start Date:  (Not on file)  Date of Service:  05/21/2024  Date of Birth:  [birth year]    PCP: Benedict Hasten, DO.    Information Obtained from: patient and history reviewed via medical record  Chief Complaint:   Chief Complaint   Patient presents with    Benign Prostatic Hypertrophy    New Patient           HPI:    Bobby Flynn is a 73 y.o., White male who presents with history of nephrolithiasis, left complex renal cyst, angiomyolipona,right, neurogenic bladder, CKD, BPH/LUTS, and nocturia. He was previously followed by Dr. Maryella whom recently retired. He takes flomax and urecholine which has significantly improved his urine stream strength and decreased nocturia. Referral from Dr. Janetta US  12/27/27 reports left complex renal cyst unchanged/stable at 7.9cm.Patient's brother with bladder cancer. Patient denies occupational chemical exposure. Patient denies history of tobacco use.           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    Raeana Blinn, PA-C

## 2024-05-22 ENCOUNTER — Ambulatory Visit (INDEPENDENT_AMBULATORY_CARE_PROVIDER_SITE_OTHER): Admitting: Physician Assistant

## 2024-05-24 ENCOUNTER — Ambulatory Visit (HOSPITAL_COMMUNITY)

## 2024-05-24 ENCOUNTER — Other Ambulatory Visit: Payer: Self-pay

## 2024-05-24 ENCOUNTER — Ambulatory Visit
Admission: RE | Admit: 2024-05-24 | Discharge: 2024-05-24 | Disposition: A | Discharge status: Home / Self Care | Payer: Self-pay | Source: Ambulatory Visit | Attending: Physician Assistant | Admitting: Physician Assistant

## 2024-05-24 DIAGNOSIS — N400 Enlarged prostate without lower urinary tract symptoms: Secondary | ICD-10-CM | POA: Insufficient documentation

## 2024-05-24 DIAGNOSIS — N281 Cyst of kidney, acquired: Secondary | ICD-10-CM | POA: Insufficient documentation

## 2024-05-26 LAB — PSA DIAGNOSTIC WITH FREE PSA REFLEX: PSA: 0.22 ng/mL (ref <=4.00)

## 2024-11-20 ENCOUNTER — Encounter (INDEPENDENT_AMBULATORY_CARE_PROVIDER_SITE_OTHER): Payer: Self-pay | Admitting: Physician Assistant

## 2024-11-20 ENCOUNTER — Other Ambulatory Visit: Payer: Self-pay

## 2024-11-20 ENCOUNTER — Ambulatory Visit (INDEPENDENT_AMBULATORY_CARE_PROVIDER_SITE_OTHER): Payer: Self-pay | Admitting: Physician Assistant

## 2024-11-20 VITALS — BP 143/76 | HR 61 | Ht 71.0 in | Wt 179.0 lb

## 2024-11-20 DIAGNOSIS — N400 Enlarged prostate without lower urinary tract symptoms: Secondary | ICD-10-CM

## 2024-11-20 DIAGNOSIS — N281 Cyst of kidney, acquired: Secondary | ICD-10-CM

## 2024-11-20 DIAGNOSIS — R351 Nocturia: Secondary | ICD-10-CM

## 2024-11-20 DIAGNOSIS — N319 Neuromuscular dysfunction of bladder, unspecified: Secondary | ICD-10-CM

## 2024-11-20 DIAGNOSIS — N401 Enlarged prostate with lower urinary tract symptoms: Secondary | ICD-10-CM

## 2024-11-20 NOTE — Progress Notes (Signed)
 [facility]                                                                        Urology Clinic       Bobby Flynn, 73 y.o. male  Encounter Start Date:  (Not on file)  Date of Service:  11/20/2024  Date of Birth:  05/24/1951    PCP: Benedict Hasten, DO.    Information Obtained from: patient and history reviewed via medical record  Chief Complaint:   Chief Complaint   Patient presents with    Benign Prostatic Hypertrophy    Renal Cyst    Neurogenic Bladder           HPI:    Bobby Flynn is a 73 y.o., White male who presents with history of nephrolithiasis, left complex renal cyst, angiomyolipona,right, neurogenic bladder, CKD, BPH/LUTS, and nocturia. He was previously followed by Dr. Maryella whom recently retired. He takes flomax  and urecholine  which has significantly improved his urine stream strength and decreased nocturia.   Referral from Dr. Janetta US  12/26/22 reports left complex renal cyst unchanged/stable at 7.9cm.    Patient's brother with bladder cancer. Patient denies occupational chemical exposure. Patient denies history of tobacco use.      Today, he presents for follow up. His renal US  05/2024 reports stable septated 7.8cm left renal cyst. Stable echogenic focus right upper pole, likely consistent with previously noted angiomyolipoma. He continues to void without difficulty with Flomax  and urecholine . He denies fever, chills, suprapubic pain, nausea, vomiting, flank pain, dysuria, or hematuria.    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    Donni Oglesby, PA-C

## 2025-05-21 ENCOUNTER — Ambulatory Visit

## 2025-05-27 ENCOUNTER — Encounter (INDEPENDENT_AMBULATORY_CARE_PROVIDER_SITE_OTHER): Payer: Self-pay | Admitting: Physician Assistant
